# Patient Record
Sex: Female | Born: 1980
Health system: Southern US, Community
[De-identification: ages and names within clinical notes are randomized; demographics above are authoritative.]

## PROBLEM LIST (undated history)

## (undated) HISTORY — PX: TONSILLECTOMY: SUR1361

## (undated) HISTORY — PX: TUBAL LIGATION: SHX77

---

## 2008-06-02 HISTORY — PX: WISDOM TOOTH EXTRACTION: SHX21

## 2013-07-11 ENCOUNTER — Encounter: Payer: Self-pay | Admitting: Family Medicine

## 2013-07-11 ENCOUNTER — Ambulatory Visit (INDEPENDENT_AMBULATORY_CARE_PROVIDER_SITE_OTHER): Payer: 59 | Admitting: Family Medicine

## 2013-07-11 ENCOUNTER — Telehealth: Payer: Self-pay | Admitting: Family Medicine

## 2013-07-11 VITALS — BP 107/76 | HR 81 | Temp 98.6°F | Ht 61.0 in | Wt 126.2 lb

## 2013-07-11 DIAGNOSIS — R21 Rash and other nonspecific skin eruption: Secondary | ICD-10-CM

## 2013-07-11 LAB — POCT WET PREP WITH KOH
Clue Cells Wet Prep HPF POC: NEGATIVE
KOH Prep POC: POSITIVE
RBC Wet Prep HPF POC: NEGATIVE
Trichomonas, UA: NEGATIVE
WBC Wet Prep HPF POC: NEGATIVE

## 2013-07-11 MED ORDER — NYSTATIN 100000 UNIT/GM EX CREA: 1.0000 "application " | TOPICAL_CREAM | Freq: Two times a day (BID) | CUTANEOUS | Status: AC

## 2013-07-11 NOTE — Telephone Encounter (Signed)
appt at 12 with wong 

## 2013-07-11 NOTE — Progress Notes (Signed)
Patient ID: Katrina Duncan, female   DOB: 1980-11-19, 33 y.o.   MRN: [id number] SUBJECTIVE: CC: Chief Complaint  Patient presents with  . Acute Visit    rash on abd area and itchy    HPI:  This  Recently started. 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 She  Recently delivered a  Baby and is breast feeding. A rash started on the abdomen as a fine bump. Has been well without any problems. But new bumps are cropping up around the sides. No fever, no recent URI. No recent sore throat or illnesses.  No other contacts with allergen. She has not been outdoors. Has a dog.  No past medical history on file. No past surgical history on file. History   Social History  . Marital Status: Married    Spouse Name: N/A    Number of Children: N/A  . Years of Education: N/A   Occupational History  . Not on file.   Social History Main Topics  . Smoking status: Never Smoker   . Smokeless tobacco: Not on file  . Alcohol Use: Not on file  . Drug Use: Not on file  . Sexual Activity: Not on file   Other Topics Concern  . Not on file   Social History Narrative  . No narrative on file   No family history on file. No current outpatient prescriptions on file prior to visit.   No current facility-administered medications on file prior to visit.   Allergies  Allergen Reactions  . Penicillins     hives    There is no immunization history on file for this patient. Prior to Admission medications   Not on File     ROS: As above in the HPI. All other systems are stable or negative.  OBJECTIVE: APPEARANCE:  Patient in no acute distress.The patient appeared well nourished and normally developed. Acyanotic. Waist: VITAL SIGNS:BP 107/76  Pulse 81  Temp(Src) 98.6 F (37 C) (Oral)  Ht 5\' 1"  (1.549 m)  Wt 126 lb 3.2 oz (57.244 kg)  BMI 23.86 kg/m2   SKIN: warm and  Dry with a fine papular rash on the abdomen around the umbilicus with scratch marks. And 2 bumps  ,one on the far right flank and the other superior to the umbilicus. No tracks  Seen. None on the extremities. No LN palpable.  HEAD and Neck: without JVD, Head and scalp: normal Eyes:No scleral icterus. Fundi normal, eye movements normal. Ears: Auricle normal, canal normal, Tympanic membranes normal, insufflation normal. Nose: normal Throat: normal Neck & thyroid: normal  CHEST & LUNGS: Chest wall: normal Lungs: Clear  CVS: Reveals the PMI to be normally located. Regular rhythm, First and Second Heart sounds are normal,  absence of murmurs, rubs or gallops. Peripheral vasculature: Radial pulses: normal Dorsal pedis pulses: normal Posterior pulses: normal  ABDOMEN:  Appearance: normal Benign, no organomegaly, no masses, no Abdominal Aortic enlargement. No Guarding , no rebound. No Bruits. Bowel sounds: normal  NEUROLOGIC: oriented to time,place and person; nonfocal. Strength is normal Sensory is normal Reflexes are normal Cranial Nerves are normal.  ASSESSMENT:  Rash and nonspecific skin eruption - Plan: POCT Wet Prep with KOH Fungal/candida/yeast? Vs other causes. Will treat as fungal for now with nystatin  PLAN:  Orders Placed This Encounter  Procedures  . POCT Wet Prep with KOH   Results for orders placed in visit on 07/11/13  POCT WET PREP WITH KOH  Result Value Range   Trichomonas, UA Negative     Clue Cells Wet Prep HPF POC neg     Epithelial Wet Prep HPF POC mod     Yeast Wet Prep HPF POC mod     Bacteria Wet Prep HPF POC occ     RBC Wet Prep HPF POC neg     WBC Wet Prep HPF POC neg     KOH Prep POC Positive     Skin care. Discussed with patient her diagnosis.  Meds ordered this encounter  Medications  . nystatin cream (MYCOSTATIN)    Sig: Apply 1 application topically 2 (two) times daily.    Dispense:  60 g    Refill:  0   There are no discontinued medications. Return if symptoms worsen or fail to improve.  Lavera Vandermeer P. Modesto Charon,  M.D.

## 2014-02-15 ENCOUNTER — Telehealth: Payer: Self-pay | Admitting: Nurse Practitioner

## 2014-02-15 NOTE — Telephone Encounter (Signed)
appt offered for today but told patient it would be a double book and patient stated tomorrow would be fine. appt scheduled for 2:30 tomorrow with Annette Stable

## 2014-02-16 ENCOUNTER — Ambulatory Visit: Payer: 59 | Admitting: Family Medicine

## 2014-08-21 ENCOUNTER — Encounter: Payer: Self-pay | Admitting: Physician Assistant

## 2014-08-21 ENCOUNTER — Ambulatory Visit (INDEPENDENT_AMBULATORY_CARE_PROVIDER_SITE_OTHER): Payer: 59 | Admitting: Physician Assistant

## 2014-08-21 VITALS — BP 110/69 | HR 95 | Temp 98.6°F | Ht 61.0 in | Wt 122.4 lb

## 2014-08-21 DIAGNOSIS — L309 Dermatitis, unspecified: Secondary | ICD-10-CM

## 2014-08-21 NOTE — Progress Notes (Signed)
   Subjective:    Patient ID: Katrina Duncan, female    DOB: Sep 09, 1980, 34 y.o.   MRN: [id number]  HPI 34 y/o pregnant female presents with non pruritic rash on right side x 2 weeks. Has used Aquaphor with no relief.     Review of Systems  Skin: Positive for color change and rash (right side, under breast ).       Objective:   Physical Exam  Skin: Rash (localitzed patch  with raised borders, erythematous, no scale under right breast) noted. There is erythema.  Vitals reviewed.         Assessment & Plan:  1. Eczema: Continue to use Dove soap, use OTC Aveeno eczema lotion several times daily as needed. OTC Hydrocortisone 1% BID x 7 days. F/U is s/s worsen or dni after 2 weeks.

## 2014-08-21 NOTE — Patient Instructions (Signed)
Eczema Eczema, also called atopic dermatitis, is a skin disorder that causes inflammation of the skin. It causes a red rash and dry, scaly skin. The skin becomes very itchy. Eczema is generally worse during the cooler winter months and often improves with the warmth of summer. Eczema usually starts showing signs in infancy. Some children outgrow eczema, but it may last through adulthood.  CAUSES  The exact cause of eczema is not known, but it appears to run in families. People with eczema often have a family history of eczema, allergies, asthma, or hay fever. Eczema is not contagious. Flare-ups of the condition may be caused by:   Contact with something you are sensitive or allergic to.   Stress. SIGNS AND SYMPTOMS  Dry, scaly skin.   Red, itchy rash.   Itchiness. This may occur before the skin rash and may be very intense.  DIAGNOSIS  The diagnosis of eczema is usually made based on symptoms and medical history. TREATMENT  Eczema cannot be cured, but symptoms usually can be controlled with treatment and other strategies. A treatment plan might include:  Controlling the itching and scratching.   Use over-the-counter antihistamines as directed for itching. This is especially useful at night when the itching tends to be worse.   Use over-the-counter steroid creams as directed for itching.   Avoid scratching. Scratching makes the rash and itching worse. It may also result in a skin infection (impetigo) due to a break in the skin caused by scratching.   Keeping the skin well moisturized with creams every day. This will seal in moisture and help prevent dryness. Lotions that contain alcohol and water should be avoided because they can dry the skin.   Limiting exposure to things that you are sensitive or allergic to (allergens).   Recognizing situations that cause stress.   Developing a plan to manage stress.  HOME CARE INSTRUCTIONS   Only take over-the-counter or  prescription medicines as directed by your health care provider.   Do not use anything on the skin without checking with your health care provider.   Keep baths or showers short (5 minutes) in warm (not hot) water. Use mild cleansers for bathing. These should be unscented. You may add nonperfumed bath oil to the bath water. It is best to avoid soap and bubble bath.   Immediately after a bath or shower, when the skin is still damp, apply a moisturizing ointment to the entire body. This ointment should be a petroleum ointment. This will seal in moisture and help prevent dryness. The thicker the ointment, the better. These should be unscented.   Keep fingernails cut short. Children with eczema may need to wear soft gloves or mittens at night after applying an ointment.   Dress in clothes made of cotton or cotton blends. Dress lightly, because heat increases itching.   A child with eczema should stay away from anyone with fever blisters or cold sores. The virus that causes fever blisters (herpes simplex) can cause a serious skin infection in children with eczema. SEEK MEDICAL CARE IF:   Your itching interferes with sleep.   Your rash gets worse or is not better within 1 week after starting treatment.   You see pus or soft yellow scabs in the rash area.   You have a fever.   You have a rash flare-up after contact with someone who has fever blisters.  Document Released: 05/16/2000 Document Revised: 03/09/2013 Document Reviewed: 12/20/2012 Exit[employer] Patient Information 2015 [employer]. This information   is not intended to replace advice given to you by your health care provider. Make sure you discuss any questions you have with your health care provider.  

## 2015-01-12 LAB — OB RESULTS CONSOLE GC/CHLAMYDIA
Chlamydia: NEGATIVE
GC PROBE AMP, GENITAL: NEGATIVE

## 2015-01-12 LAB — OB RESULTS CONSOLE HIV ANTIBODY (ROUTINE TESTING): HIV: NONREACTIVE

## 2015-01-12 LAB — OB RESULTS CONSOLE RUBELLA ANTIBODY, IGM: RUBELLA: IMMUNE

## 2015-01-12 LAB — OB RESULTS CONSOLE HEPATITIS B SURFACE ANTIGEN: Hepatitis B Surface Ag: NEGATIVE

## 2015-01-12 LAB — OB RESULTS CONSOLE RPR: RPR: NONREACTIVE

## 2015-02-14 ENCOUNTER — Ambulatory Visit (INDEPENDENT_AMBULATORY_CARE_PROVIDER_SITE_OTHER): Payer: 59 | Admitting: Family Medicine

## 2015-02-14 ENCOUNTER — Encounter (INDEPENDENT_AMBULATORY_CARE_PROVIDER_SITE_OTHER): Payer: Self-pay

## 2015-02-14 ENCOUNTER — Encounter: Payer: Self-pay | Admitting: Family Medicine

## 2015-02-14 VITALS — BP 106/70 | HR 99 | Temp 98.2°F | Ht 61.0 in | Wt 150.2 lb

## 2015-02-14 DIAGNOSIS — J209 Acute bronchitis, unspecified: Secondary | ICD-10-CM

## 2015-02-14 MED ORDER — CEFDINIR 300 MG PO CAPS: 300.0000 mg | ORAL_CAPSULE | Freq: Two times a day (BID) | ORAL | Status: DC

## 2015-02-14 MED ORDER — FLUTICASONE PROPIONATE 50 MCG/ACT NA SUSP: 2.0000 | Freq: Every day | NASAL | Status: DC

## 2015-02-14 MED ORDER — ALBUTEROL SULFATE HFA 108 (90 BASE) MCG/ACT IN AERS: 2.0000 | INHALATION_SPRAY | Freq: Four times a day (QID) | RESPIRATORY_TRACT | Status: DC | PRN

## 2015-02-14 NOTE — Progress Notes (Signed)
BP 106/70 mmHg  Pulse 99  Temp(Src) 98.2 F (36.8 C) (Oral)  Ht 5\' 1"  (1.549 m)  Wt 150 lb 3.2 oz (68.13 kg)  BMI 28.39 kg/m2  LMP 07/02/2014   Subjective:    Patient ID: Katrina Duncan, female    DOB: 04-29-1981, 34 y.o.   MRN: [id number]  HPI: Katrina Duncan is a 34 y.o. female presenting on 02/14/2015 for Chest congestion and Cough   HPI Sore throat Patient has been having difficulty with sore throat and congestion for 3 weeks. She also has had fevers and postnasal drainage and sinus pressure over the past 3 weeks. She felt like it was getting better after the first week but then it has worsened again in the past week. Her husband works at a nursing home and has come down with something major from that. She had a low-grade fever a week ago and feels like that may be starting up again. Her cough is productive of clear to white sputum.  Relevant past medical, surgical, family and social history reviewed and updated as indicated. Interim medical history since our last visit reviewed. Allergies and medications reviewed and updated.  Review of Systems  Constitutional: Negative for fever and chills.  HENT: Positive for postnasal drip, rhinorrhea, sinus pressure and sore throat. Negative for congestion, ear discharge, ear pain and voice change.   Eyes: Negative for redness and visual disturbance.  Respiratory: Positive for cough. Negative for chest tightness, shortness of breath and wheezing.   Cardiovascular: Negative for chest pain, palpitations and leg swelling.  Genitourinary: Negative for dysuria and difficulty urinating.  Musculoskeletal: Negative for back pain and gait problem.  Skin: Negative for rash.  Neurological: Negative for light-headedness and headaches.  Psychiatric/Behavioral: Negative for behavioral problems and agitation.  All other systems reviewed and are negative.   Per HPI unless specifically indicated above     Medication List       This  list is accurate as of: 02/14/15 12:46 PM.  Always use your most recent med list.               albuterol 108 (90 BASE) MCG/ACT inhaler  Commonly known as:  PROVENTIL HFA;VENTOLIN HFA  Inhale 2 puffs into the lungs every 6 (six) hours as needed for wheezing or shortness of breath.     calcium carbonate 1250 MG capsule  Take 1,250 mg by mouth 2 (two) times daily with a meal.     cefdinir 300 MG capsule  Commonly known as:  OMNICEF  Take 1 capsule (300 mg total) by mouth 2 (two) times daily. 1 po BID     fluticasone 50 MCG/ACT nasal spray  Commonly known as:  FLONASE  Place 2 sprays into both nostrils daily.     multivitamin-prenatal 27-0.8 MG Tabs tablet  Take 1 tablet by mouth daily at 12 noon.           Objective:    BP 106/70 mmHg  Pulse 99  Temp(Src) 98.2 F (36.8 C) (Oral)  Ht 5\' 1"  (1.549 m)  Wt 150 lb 3.2 oz (68.13 kg)  BMI 28.39 kg/m2  LMP 07/02/2014  Wt Readings from Last 3 Encounters:  02/14/15 150 lb 3.2 oz (68.13 kg)  08/21/14 122 lb 6.4 oz (55.52 kg)    Physical Exam  Constitutional: She is oriented to person, place, and time. She appears well-developed and well-nourished. No distress.  HENT:  Right Ear: Tympanic membrane, external ear and ear canal normal.  Left  Ear: Tympanic membrane, external ear and ear canal normal.  Nose: Mucosal edema and rhinorrhea present. Right sinus exhibits no maxillary sinus tenderness and no frontal sinus tenderness. Left sinus exhibits no maxillary sinus tenderness and no frontal sinus tenderness.  Mouth/Throat: Uvula is midline and mucous membranes are normal. Posterior oropharyngeal edema and posterior oropharyngeal erythema present. No oropharyngeal exudate or tonsillar abscesses.  Eyes: Conjunctivae and EOM are normal. Pupils are equal, round, and reactive to light.  Cardiovascular: Normal rate, regular rhythm, normal heart sounds and intact distal pulses.   No murmur heard. Pulmonary/Chest: Effort normal and breath  sounds normal. No respiratory distress. She has no wheezes.  Musculoskeletal: Normal range of motion. She exhibits no edema or tenderness.  Neurological: She is alert and oriented to person, place, and time. Coordination normal.  Skin: Skin is warm and dry. No rash noted. She is not diaphoretic.  Psychiatric: She has a normal mood and affect. Her behavior is normal.  Vitals reviewed.   No results found for this or any previous visit.    Assessment & Plan:   Problem List Items Addressed This Visit    None    Visit Diagnoses    Acute bronchitis, unspecified organism    -  Primary    Persistent symptoms for 3 weeks. Pregnancy makes her immunosuppressed. We'll try albuterol, Flonase, Omnicef.    Relevant Medications    albuterol (PROVENTIL HFA;VENTOLIN HFA) 108 (90 BASE) MCG/ACT inhaler    fluticasone (FLONASE) 50 MCG/ACT nasal spray    cefdinir (OMNICEF) 300 MG capsule        Follow up plan: Return if symptoms worsen or fail to improve.  Arville Care, MD [facility] 02/14/2015, 12:46 PM

## 2015-02-14 NOTE — Patient Instructions (Signed)

## 2015-03-06 LAB — OB RESULTS CONSOLE GBS: STREP GROUP B AG: POSITIVE

## 2015-03-30 ENCOUNTER — Inpatient Hospital Stay (HOSPITAL_COMMUNITY): Payer: 59 | Admitting: Anesthesiology

## 2015-03-30 ENCOUNTER — Encounter (HOSPITAL_COMMUNITY): Payer: Self-pay

## 2015-03-30 ENCOUNTER — Encounter (HOSPITAL_COMMUNITY): Payer: Self-pay | Admitting: Anesthesiology

## 2015-03-30 ENCOUNTER — Encounter (HOSPITAL_COMMUNITY)
Admission: AD | Disposition: A | Discharge status: Home / Self Care | Payer: Self-pay | Source: Ambulatory Visit | Attending: Obstetrics and Gynecology

## 2015-03-30 ENCOUNTER — Inpatient Hospital Stay (HOSPITAL_COMMUNITY)
Admission: AD | Admit: 2015-03-30 | Discharge: 2015-04-01 | DRG: 766 | Disposition: A | Discharge status: Home / Self Care | Payer: 59 | Source: Ambulatory Visit | Attending: Obstetrics and Gynecology | Admitting: Obstetrics and Gynecology

## 2015-03-30 DIAGNOSIS — O34219 Maternal care for unspecified type scar from previous cesarean delivery: Secondary | ICD-10-CM | POA: Diagnosis present

## 2015-03-30 DIAGNOSIS — Z3A38 38 weeks gestation of pregnancy: Secondary | ICD-10-CM | POA: Diagnosis not present

## 2015-03-30 DIAGNOSIS — Z98891 History of uterine scar from previous surgery: Secondary | ICD-10-CM

## 2015-03-30 DIAGNOSIS — O4292 Full-term premature rupture of membranes, unspecified as to length of time between rupture and onset of labor: Secondary | ICD-10-CM | POA: Diagnosis present

## 2015-03-30 DIAGNOSIS — Z9851 Tubal ligation status: Secondary | ICD-10-CM

## 2015-03-30 DIAGNOSIS — Z302 Encounter for sterilization: Secondary | ICD-10-CM | POA: Diagnosis not present

## 2015-03-30 DIAGNOSIS — O99824 Streptococcus B carrier state complicating childbirth: Secondary | ICD-10-CM | POA: Diagnosis present

## 2015-03-30 DIAGNOSIS — O4290 Premature rupture of membranes, unspecified as to length of time between rupture and onset of labor, unspecified weeks of gestation: Secondary | ICD-10-CM | POA: Diagnosis present

## 2015-03-30 LAB — RPR: RPR: NONREACTIVE

## 2015-03-30 LAB — CBC
HCT: 35.6 % — ABNORMAL LOW (ref 36.0–46.0)
Hemoglobin: 12.2 g/dL (ref 12.0–15.0)
MCH: 32.2 pg (ref 26.0–34.0)
MCHC: 34.3 g/dL (ref 30.0–36.0)
MCV: 93.9 fL (ref 78.0–100.0)
Platelets: 149 10*3/uL — ABNORMAL LOW (ref 150–400)
RBC: 3.79 MIL/uL — ABNORMAL LOW (ref 3.87–5.11)
RDW: 13.1 % (ref 11.5–15.5)
WBC: 11.4 10*3/uL — ABNORMAL HIGH (ref 4.0–10.5)

## 2015-03-30 LAB — TYPE AND SCREEN
ABO/RH(D): A POS
Antibody Screen: NEGATIVE

## 2015-03-30 LAB — ABO/RH: ABO/RH(D): A POS

## 2015-03-30 LAB — POCT FERN TEST: POCT FERN TEST: POSITIVE

## 2015-03-30 SURGERY — Surgical Case
Anesthesia: Spinal

## 2015-03-30 MED ORDER — KETOROLAC TROMETHAMINE 30 MG/ML IJ SOLN
30.0000 mg | Freq: Four times a day (QID) | INTRAMUSCULAR | Status: AC | PRN
  Administered 2015-03-30: 30 mg via INTRAMUSCULAR

## 2015-03-30 MED ORDER — NALBUPHINE HCL 10 MG/ML IJ SOLN: 5.0000 mg | Freq: Once | INTRAMUSCULAR | Status: DC | PRN

## 2015-03-30 MED ORDER — ONDANSETRON HCL 4 MG/2ML IJ SOLN
INTRAMUSCULAR | Status: AC
  Filled 2015-03-30: qty 2

## 2015-03-30 MED ORDER — NALBUPHINE HCL 10 MG/ML IJ SOLN: 5.0000 mg | INTRAMUSCULAR | Status: DC | PRN

## 2015-03-30 MED ORDER — TETANUS-DIPHTH-ACELL PERTUSSIS 5-2.5-18.5 LF-MCG/0.5 IM SUSP: 0.5000 mL | Freq: Once | INTRAMUSCULAR | Status: DC

## 2015-03-30 MED ORDER — NALOXONE HCL 0.4 MG/ML IJ SOLN: 0.4000 mg | INTRAMUSCULAR | Status: DC | PRN

## 2015-03-30 MED ORDER — ERYTHROMYCIN 5 MG/GM OP OINT
TOPICAL_OINTMENT | OPHTHALMIC | Status: AC
  Filled 2015-03-30: qty 1

## 2015-03-30 MED ORDER — IBUPROFEN 600 MG PO TABS
600.0000 mg | ORAL_TABLET | Freq: Four times a day (QID) | ORAL | Status: DC | PRN
  Administered 2015-03-31 (×2): 600 mg via ORAL

## 2015-03-30 MED ORDER — PHENYLEPHRINE 8 MG IN D5W 100 ML (0.08MG/ML) PREMIX OPTIME
INJECTION | INTRAVENOUS | Status: DC | PRN
  Administered 2015-03-30: 60 ug/min via INTRAVENOUS

## 2015-03-30 MED ORDER — OXYCODONE-ACETAMINOPHEN 5-325 MG PO TABS: 2.0000 | ORAL_TABLET | ORAL | Status: DC | PRN

## 2015-03-30 MED ORDER — SCOPOLAMINE 1 MG/3DAYS TD PT72
1.0000 | MEDICATED_PATCH | TRANSDERMAL | Status: DC
  Administered 2015-03-30: 1.5 mg via TRANSDERMAL
  Filled 2015-03-30: qty 1

## 2015-03-30 MED ORDER — KETOROLAC TROMETHAMINE 30 MG/ML IJ SOLN
INTRAMUSCULAR | Status: AC
  Filled 2015-03-30: qty 1

## 2015-03-30 MED ORDER — PRENATAL MULTIVITAMIN CH
1.0000 | ORAL_TABLET | Freq: Every day | ORAL | Status: DC
  Administered 2015-03-31 – 2015-04-01 (×2): 1 via ORAL
  Filled 2015-03-30 (×2): qty 1

## 2015-03-30 MED ORDER — 0.9 % SODIUM CHLORIDE (POUR BTL) OPTIME
TOPICAL | Status: DC | PRN
  Administered 2015-03-30: 1000 mL

## 2015-03-30 MED ORDER — FENTANYL CITRATE (PF) 100 MCG/2ML IJ SOLN
INTRAMUSCULAR | Status: AC
  Filled 2015-03-30: qty 4

## 2015-03-30 MED ORDER — MORPHINE SULFATE (PF) 0.5 MG/ML IJ SOLN
INTRAMUSCULAR | Status: DC | PRN
  Administered 2015-03-30: .15 mg via INTRATHECAL

## 2015-03-30 MED ORDER — LACTATED RINGERS IV SOLN
INTRAVENOUS | Status: DC | PRN
  Administered 2015-03-30: 08:00:00 via INTRAVENOUS

## 2015-03-30 MED ORDER — SENNOSIDES-DOCUSATE SODIUM 8.6-50 MG PO TABS
2.0000 | ORAL_TABLET | ORAL | Status: DC
  Administered 2015-03-31 – 2015-04-01 (×2): 2 via ORAL
  Filled 2015-03-30 (×2): qty 2

## 2015-03-30 MED ORDER — ZOLPIDEM TARTRATE 5 MG PO TABS: 5.0000 mg | ORAL_TABLET | Freq: Every evening | ORAL | Status: DC | PRN

## 2015-03-30 MED ORDER — CLINDAMYCIN PHOSPHATE 900 MG/50ML IV SOLN
900.0000 mg | Freq: Once | INTRAVENOUS | Status: AC
  Administered 2015-03-30: 900 mg via INTRAVENOUS
  Filled 2015-03-30: qty 50

## 2015-03-30 MED ORDER — ONDANSETRON HCL 4 MG/2ML IJ SOLN: 4.0000 mg | Freq: Three times a day (TID) | INTRAMUSCULAR | Status: DC | PRN

## 2015-03-30 MED ORDER — BUPIVACAINE IN DEXTROSE 0.75-8.25 % IT SOLN
INTRATHECAL | Status: DC | PRN
Start: 2015-03-30 — End: 2015-03-30
  Administered 2015-03-30: 1.4 mL via INTRATHECAL

## 2015-03-30 MED ORDER — DIPHENHYDRAMINE HCL 25 MG PO CAPS: 25.0000 mg | ORAL_CAPSULE | Freq: Four times a day (QID) | ORAL | Status: DC | PRN

## 2015-03-30 MED ORDER — CITRIC ACID-SODIUM CITRATE 334-500 MG/5ML PO SOLN
30.0000 mL | Freq: Once | ORAL | Status: AC
  Administered 2015-03-30: 30 mL via ORAL
  Filled 2015-03-30: qty 15

## 2015-03-30 MED ORDER — LACTATED RINGERS IV SOLN
INTRAVENOUS | Status: DC | PRN
  Administered 2015-03-30: 09:00:00 via INTRAVENOUS

## 2015-03-30 MED ORDER — OXYTOCIN 10 UNIT/ML IJ SOLN
40.0000 [IU] | INTRAVENOUS | Status: DC | PRN
  Administered 2015-03-30: 40 [IU] via INTRAVENOUS

## 2015-03-30 MED ORDER — FENTANYL CITRATE (PF) 100 MCG/2ML IJ SOLN
INTRAMUSCULAR | Status: DC | PRN
  Administered 2015-03-30: 25 ug via INTRATHECAL

## 2015-03-30 MED ORDER — DIPHENHYDRAMINE HCL 50 MG/ML IJ SOLN: 12.5000 mg | INTRAMUSCULAR | Status: DC | PRN

## 2015-03-30 MED ORDER — FAMOTIDINE IN NACL 20-0.9 MG/50ML-% IV SOLN
20.0000 mg | Freq: Once | INTRAVENOUS | Status: AC
  Administered 2015-03-30: 20 mg via INTRAVENOUS
  Filled 2015-03-30: qty 50

## 2015-03-30 MED ORDER — SCOPOLAMINE 1 MG/3DAYS TD PT72
1.0000 | MEDICATED_PATCH | Freq: Once | TRANSDERMAL | Status: DC
  Filled 2015-03-30: qty 1

## 2015-03-30 MED ORDER — FENTANYL CITRATE (PF) 100 MCG/2ML IJ SOLN: 25.0000 ug | INTRAMUSCULAR | Status: DC | PRN

## 2015-03-30 MED ORDER — ACETAMINOPHEN 325 MG PO TABS
650.0000 mg | ORAL_TABLET | ORAL | Status: DC | PRN
  Administered 2015-03-31: 650 mg via ORAL
  Filled 2015-03-30: qty 2

## 2015-03-30 MED ORDER — NALOXONE HCL 2 MG/2ML IJ SOSY
1.0000 ug/kg/h | PREFILLED_SYRINGE | INTRAVENOUS | Status: DC | PRN
  Filled 2015-03-30: qty 2

## 2015-03-30 MED ORDER — ONDANSETRON HCL 4 MG/2ML IJ SOLN
INTRAMUSCULAR | Status: DC | PRN
  Administered 2015-03-30: 4 mg via INTRAVENOUS

## 2015-03-30 MED ORDER — DIBUCAINE 1 % RE OINT: 1.0000 "application " | TOPICAL_OINTMENT | RECTAL | Status: DC | PRN

## 2015-03-30 MED ORDER — LACTATED RINGERS IV SOLN
INTRAVENOUS | Status: DC
  Administered 2015-03-30: 18:00:00 via INTRAVENOUS

## 2015-03-30 MED ORDER — SIMETHICONE 80 MG PO CHEW: 80.0000 mg | CHEWABLE_TABLET | ORAL | Status: DC | PRN

## 2015-03-30 MED ORDER — SODIUM CHLORIDE 0.9 % IJ SOLN
3.0000 mL | INTRAMUSCULAR | Status: DC | PRN
  Administered 2015-03-31: 3 mL via INTRAVENOUS
  Filled 2015-03-30: qty 3

## 2015-03-30 MED ORDER — MORPHINE SULFATE (PF) 0.5 MG/ML IJ SOLN
INTRAMUSCULAR | Status: AC
  Filled 2015-03-30: qty 100

## 2015-03-30 MED ORDER — IBUPROFEN 600 MG PO TABS
600.0000 mg | ORAL_TABLET | Freq: Four times a day (QID) | ORAL | Status: DC
  Administered 2015-03-30 – 2015-04-01 (×6): 600 mg via ORAL
  Filled 2015-03-30 (×8): qty 1

## 2015-03-30 MED ORDER — SIMETHICONE 80 MG PO CHEW
80.0000 mg | CHEWABLE_TABLET | Freq: Three times a day (TID) | ORAL | Status: DC
  Administered 2015-03-30 – 2015-04-01 (×5): 80 mg via ORAL
  Filled 2015-03-30 (×5): qty 1

## 2015-03-30 MED ORDER — OXYTOCIN 40 UNITS IN LACTATED RINGERS INFUSION - SIMPLE MED: 62.5000 mL/h | INTRAVENOUS | Status: AC

## 2015-03-30 MED ORDER — OXYTOCIN 10 UNIT/ML IJ SOLN
INTRAMUSCULAR | Status: AC
  Filled 2015-03-30: qty 4

## 2015-03-30 MED ORDER — KETOROLAC TROMETHAMINE 30 MG/ML IJ SOLN: 30.0000 mg | Freq: Four times a day (QID) | INTRAMUSCULAR | Status: AC | PRN

## 2015-03-30 MED ORDER — DIPHENHYDRAMINE HCL 25 MG PO CAPS: 25.0000 mg | ORAL_CAPSULE | ORAL | Status: DC | PRN

## 2015-03-30 MED ORDER — LACTATED RINGERS IV BOLUS (SEPSIS)
1000.0000 mL | Freq: Once | INTRAVENOUS | Status: AC
  Administered 2015-03-30: 1000 mL via INTRAVENOUS

## 2015-03-30 MED ORDER — WITCH HAZEL-GLYCERIN EX PADS: 1.0000 "application " | MEDICATED_PAD | CUTANEOUS | Status: DC | PRN

## 2015-03-30 MED ORDER — OXYCODONE-ACETAMINOPHEN 5-325 MG PO TABS: 1.0000 | ORAL_TABLET | ORAL | Status: DC | PRN

## 2015-03-30 MED ORDER — PHENYLEPHRINE 8 MG IN D5W 100 ML (0.08MG/ML) PREMIX OPTIME
INJECTION | INTRAVENOUS | Status: AC
  Filled 2015-03-30: qty 100

## 2015-03-30 MED ORDER — LANOLIN HYDROUS EX OINT: 1.0000 "application " | TOPICAL_OINTMENT | CUTANEOUS | Status: DC | PRN

## 2015-03-30 MED ORDER — MENTHOL 3 MG MT LOZG: 1.0000 | LOZENGE | OROMUCOSAL | Status: DC | PRN

## 2015-03-30 MED ORDER — MEPERIDINE HCL 25 MG/ML IJ SOLN: 6.2500 mg | INTRAMUSCULAR | Status: DC | PRN

## 2015-03-30 MED ORDER — SIMETHICONE 80 MG PO CHEW
80.0000 mg | CHEWABLE_TABLET | ORAL | Status: DC
  Administered 2015-03-31 – 2015-04-01 (×2): 80 mg via ORAL
  Filled 2015-03-30 (×2): qty 1

## 2015-03-30 SURGICAL SUPPLY — 33 items
BENZOIN TINCTURE PRP APPL 2/3 (GAUZE/BANDAGES/DRESSINGS) ×2 IMPLANT
CLAMP CORD UMBIL (MISCELLANEOUS) IMPLANT
CLOTH BEACON ORANGE TIMEOUT ST (SAFETY) ×2 IMPLANT
DRAPE C SECTION CLR SCREEN (DRAPES) ×2 IMPLANT
DRAPE SHEET LG 3/4 BI-LAMINATE (DRAPES) IMPLANT
DRSG OPSITE 6X11 MED (GAUZE/BANDAGES/DRESSINGS) ×2 IMPLANT
DRSG OPSITE POSTOP 4X10 (GAUZE/BANDAGES/DRESSINGS) ×2 IMPLANT
DURAPREP 26ML APPLICATOR (WOUND CARE) ×2 IMPLANT
ELECT REM PT RETURN 9FT ADLT (ELECTROSURGICAL) ×2
ELECTRODE REM PT RTRN 9FT ADLT (ELECTROSURGICAL) ×1 IMPLANT
EXTRACTOR VACUUM KIWI (MISCELLANEOUS) IMPLANT
GLOVE BIO SURGEON STRL SZ 6.5 (GLOVE) ×2 IMPLANT
GOWN STRL REUS W/TWL LRG LVL3 (GOWN DISPOSABLE) ×4 IMPLANT
KIT ABG SYR 3ML LUER SLIP (SYRINGE) IMPLANT
NEEDLE HYPO 25X5/8 SAFETYGLIDE (NEEDLE) IMPLANT
NS IRRIG 1000ML POUR BTL (IV SOLUTION) ×2 IMPLANT
PACK C SECTION WH (CUSTOM PROCEDURE TRAY) ×2 IMPLANT
PAD OB MATERNITY 4.3X12.25 (PERSONAL CARE ITEMS) ×2 IMPLANT
PENCIL SMOKE EVAC W/HOLSTER (ELECTROSURGICAL) ×2 IMPLANT
RTRCTR C-SECT PINK 25CM LRG (MISCELLANEOUS) ×2 IMPLANT
STRIP CLOSURE SKIN 1/2X4 (GAUZE/BANDAGES/DRESSINGS) ×2 IMPLANT
SUT CHROMIC 1 CTX 36 (SUTURE) ×4 IMPLANT
SUT PLAIN 0 NONE (SUTURE) ×2 IMPLANT
SUT PLAIN 2 0 XLH (SUTURE) ×2 IMPLANT
SUT VIC AB 0 CT1 27 (SUTURE) ×2
SUT VIC AB 0 CT1 27XBRD ANBCTR (SUTURE) ×2 IMPLANT
SUT VIC AB 2-0 CT1 27 (SUTURE) ×1
SUT VIC AB 2-0 CT1 TAPERPNT 27 (SUTURE) ×1 IMPLANT
SUT VIC AB 3-0 CT1 27 (SUTURE)
SUT VIC AB 3-0 CT1 TAPERPNT 27 (SUTURE) IMPLANT
SUT VIC AB 4-0 KS 27 (SUTURE) ×2 IMPLANT
TOWEL OR 17X24 6PK STRL BLUE (TOWEL DISPOSABLE) ×2 IMPLANT
TRAY FOLEY CATH SILVER 14FR (SET/KITS/TRAYS/PACK) ×2 IMPLANT

## 2015-03-30 NOTE — Lactation Note (Signed)
This note was copied from the chart of [name]. Lactation Consultation Note  Patient Name: [name] ZOXWR'UToday's Date: 03/30/2015 Reason for consult: Initial assessment (mom knows to call with feeding cues)  Baby is 5 hours old , breast fed x 1 in PACU for 10 mins. Both parents mentioned how impressed they were that the  Baby latched so well with swallows compared to there 1st baby 2 years ago .  LC reviewed basics - 1st changed a large wet diaper , and showed parents the blue line on the diaper.  Showed mom breast massage , hand express, several large drops of colostrum noted, baby opened wide , took a few sucks and released and  Back to sleep. Baby in football position skin to skin with mom. Mom and dad awake.  Mom mentioned her plan is to breast and bottle like her 1st baby . Mom already asking for formula.  LC mentioned we are here to support her decision to breast and formula . LC also recommended to mom and dad to consider allowing baby to get hungry  And work on giving him the opportunity to latch and enhance milk coming in quicker. Supply and demand. LC stressed the importance of skin to skin , hand expressing.  Per mom plans to obtain a DEBP after D/C form [facility] , for the Healthy pregnancy program.  Mother informed of post-discharge support and given phone number to the lactation department, including services for phone call assistance; out-patient appointments; and breastfeeding support group. List of other breastfeeding resources in the community given in the handout. Encouraged mother to call for problems or concerns related to breastfeeding. Mom and dad are aware to call for latch assessment.    Maternal Data Has patient been taught Hand Expression?: Yes (several small drops of colostrum noted. mom aware of how to use collector for colostrum ) Does the patient have breastfeeding experience prior to this delivery?: Yes  Feeding Feeding Type: Breast  Fed Length of feed:  (baby opened wide, latched with few sucks , shallow )  LATCH Score/Interventions Latch: Repeated attempts needed to sustain latch, nipple held in mouth throughout feeding, stimulation needed to elicit sucking reflex. Intervention(s): Adjust position;Assist with latch;Breast massage;Breast compression  Audible Swallowing: None Intervention(s): Skin to skin;Hand expression;Alternate breast massage  Type of Nipple: Everted at rest and after stimulation  Comfort (Breast/Nipple): Soft / non-tender     Hold (Positioning): Assistance needed to correctly position infant at breast and maintain latch. Intervention(s): Breastfeeding basics reviewed;Support Pillows;Position options;Skin to skin  LATCH Score: 6  Lactation Tools Discussed/Used WIC Program: No   Consult Status Consult Status: Follow-up Date: 03/30/15 Follow-up type: In-patient    Kathrin Duncan, Katrina Duncan 03/30/2015, 2:36 PM

## 2015-03-30 NOTE — H&P (Signed)
Katrina Duncan is a 34 y.o. female presenting for rupture of menmbranes. Pt was confirmed to be ruptured with light meconium stained fluid in MAU. She is appreciating some contractions but tolerating well. Prenatal care was uncomplicated - had a UTI that was treated. Her EDC is based on LMP and confirmed by first trimester u/s. She has a history of LTC/S and opted for repeat c/s this time. She is also requesting a bilateral tubal ligation. All options reviewed in depth - consent signed Maternal Medical History:  Reason for admission: Rupture of membranes.  Nausea.  Contractions: Onset was 1-2 hours ago.   Frequency: irregular.   Duration is approximately 5 minutes.   Perceived severity is mild.    Fetal activity: Perceived fetal activity is normal.   Last perceived fetal movement was within the past hour.    Prenatal complications: no prenatal complications   OB History    Gravida Para Term Preterm AB TAB SAB Ectopic Multiple Living   0 0 0 0 0 0 1     History reviewed. No pertinent past medical history. Past Surgical History  Procedure Laterality Date  . Tonsillectomy    . Cesarean section  2014  . Cesarean section  2014   Family History: family history includes Cancer in her father and mother; Lupus in her mother; Sjogren's syndrome in her mother; Ulcerative colitis in her father. Social History:  reports that she has never smoked. She does not have any smokeless tobacco history on file. She reports that she does not drink alcohol or use illicit drugs.   Prenatal Transfer Tool  Maternal Diabetes: No Genetic Screening: Declined Maternal Ultrasounds/Referrals: Normal Fetal Ultrasounds or other Referrals:  None Maternal Substance Abuse:  No Significant Maternal Medications:  None Significant Maternal Lab Results:  Lab values include: Group B Strep positive Other Comments:  allergy to PCN- rash  Review of Systems  Constitutional: Negative for fever, chills and  weight loss.  Eyes: Negative for blurred vision.  Respiratory: Negative for shortness of breath.   Cardiovascular: Negative for chest pain.  Gastrointestinal: Negative for heartburn, nausea, vomiting and abdominal pain.  Genitourinary: Negative for dysuria.  Musculoskeletal: Negative for back pain.  Neurological: Negative for dizziness and headaches.  Psychiatric/Behavioral: Negative for depression. The patient is nervous/anxious.     Dilation: 2.5 Effacement (%): 50 Station: -3 Exam by:: D Simpson RN Blood pressure 127/81, pulse 89, temperature 98.3 F (36.8 C), temperature source Oral, resp. rate 18, height 5' 1.5" (1.562 m), weight 155 lb (70.308 kg), last menstrual period 07/02/2014, SpO2 100 %. Maternal Exam:  Uterine Assessment: Contraction strength is mild.  Contraction duration is 5 minutes. Contraction frequency is irregular.   Abdomen: Patient reports no abdominal tenderness. Surgical scars: low transverse.   Estimated fetal weight is AGA.   Fetal presentation: vertex  Introitus: Normal vulva. Normal vagina.  Ferning test: not done.  Nitrazine test: not done. Amniotic fluid character: meconium stained. Evaluated by MAU staff  Cervix: Cervix evaluated by digital exam.   By MAU staff  Physical Exam  Constitutional: She is oriented to person, place, and time. She appears well-developed and well-nourished.  HENT:  Head: Normocephalic.  Neck: Normal range of motion.  Cardiovascular: Normal rate.   Respiratory: Effort normal.  GI: Soft.  Genitourinary: Vagina normal and uterus normal.  Musculoskeletal: Normal range of motion.  Neurological: She is alert and oriented to person, place, and time.  Skin: Skin is warm.  Psychiatric: She has a  normal mood and affect. Her behavior is normal. Judgment and thought content normal.    Prenatal labs: ABO, Rh:   Antibody:   Rubella:   RPR:    HBsAg:    HIV:    GBS:     Assessment/Plan: G2P1001 at 38 5/[redacted]wks ga for repeat  c/s and btl due to SROM  Dose of clindamycin given preop Consent verified To OR when ready   Sharol Givenecilia Worema Shona Pardo 03/30/2015, 8:00 AM

## 2015-03-30 NOTE — Addendum Note (Signed)
Addendum  created 03/30/15 1939 by Renford DillsJanet L Dejanique Ruehl, CRNA   Modules edited: Notes Section   Notes Section:  File: [id number]

## 2015-03-30 NOTE — Op Note (Signed)
Procedure note  Patient was taken to the operating room where spinal anesthesia was administered. She was prepped and draped in the normal sterile fashion in the dorsal supine position with a leftward tilt. An appropriate time out was performed. An allis clamp test was performed and anesthesia was found to be adequate.  A Pfannenstiel skin incision was then made through the previous incision with the scalpel and carried through to the underlying layer of fascia by sharp dissection. The fascia was nicked in the midline and the incision was extended laterally with Mayo scissors. The superior aspect of the incision was grasped Coker clamps and dissected off the underlying rectus muscles. In a similar fashion the inferior aspect was dissected off the rectus muscles. Rectus muscles were separated in the midline and the peritoneal cavity entered bluntly. The peritoneal incision was then extended both superiorly and inferiorly with careful attention to avoid both bowel and bladder. The Alexis self-retaining wound retractor was then placed within the incision and the lower uterine segment exposed. The bladder flap was developed with Metzenbaum scissors and pushed away from the lower uterine segment. The lower uterine segment was then incised in a transverse fashion and the cavity itself entered bluntly. Clear amniotic fluid was noted.  The incision was extended bluntly. The infant's head was then lifted and delivered from the incision without difficulty. The remainder of the infant delivered and the nose and mouth bulb suctioned with the cord clamped and cut as well. The infant was handed off to the waiting pediatricians. The placenta was then spontaneously expressed from the uterus and the uterus cleared of all clots and debris with moist lap sponge. The uterine incision was then repaired in a single layer with a  running locked layer 0 chromic suture. A figure 8 stitch of the same ws placed in middle of incision to stop  some mild bleeding. The left and then right tubes were then grasped with babcocks, clamped down to their fimbriated ends cut and tied using two 0 vicryl sutures; Second suture was placed under the first. Excellent hemostasis was appreciated. The  ovaries were inspected and the gutters cleared of all clots and debris. The uterine incision was inspected and found to be hemostatic. All instruments and sponges as well as the Alexis retractor were then removed from the abdomen. The rectus muscles and peritoneum were then reapproximated with 2-0 Vicryl. The fascia was then closed with 0 Vicryl in a running fashion. Subcutaneous tissue was irrigated and small areas of bleeding bovied. Layer was thin so did not need to be reapproximated. The skin was closed with a subcuticular stitch of 4-0 Vicryl on a Keith needle and then reinforced with benzoin and Steri-Strips. At the conclusion of the procedure all instruments and sponge counts were correct. Patient was taken to the recovery room in good condition with her baby accompanying her skin to skin.

## 2015-03-30 NOTE — Anesthesia Procedure Notes (Signed)
Spinal Patient location during procedure: OR Start time: 03/30/2015 8:24 AM Staffing Anesthesiologist: Mal AmabileFOSTER, Shaneka Efaw Performed by: anesthesiologist  Preanesthetic Checklist Completed: patient identified, site marked, surgical consent, pre-op evaluation, timeout performed, IV checked, risks and benefits discussed and monitors and equipment checked Spinal Block Patient position: sitting Prep: site prepped and draped and DuraPrep Patient monitoring: heart rate, cardiac monitor, continuous pulse ox and blood pressure Approach: midline Location: L3-4 Injection technique: single-shot Needle Needle type: Sprotte  Needle gauge: 24 G Needle length: 9 cm Assessment Sensory level: T4 Additional Notes Patient tolerated procedure well. Adequate sensory level.

## 2015-03-30 NOTE — Anesthesia Postprocedure Evaluation (Signed)
  Anesthesia Post-op Note  Patient: Katrina Duncan  Procedure(s) Performed: Procedure(s): CESAREAN SECTION (N/A)  Patient Location: 144   Anesthesia Type:Spinal  Level of Consciousness: awake  Airway and Oxygen Therapy: Patient Spontanous Breathing  Post-op Pain: mild  Post-op Assessment: Patient's Cardiovascular Status Stable and Respiratory Function Stable              Post-op Vital Signs: stable  Last Vitals:  Filed Vitals:   03/30/15 1800  BP: 104/66  Pulse: 77  Temp: 36.8 C  Resp: 18    Complications: No apparent anesthesia complications

## 2015-03-30 NOTE — MAU Note (Signed)
Pt states that her water broke at 0500-clear/pink fluid. Having some irregular contractions every 5 mins. +FM

## 2015-03-30 NOTE — Transfer of Care (Signed)
Immediate Anesthesia Transfer of Care Note  Patient: Katrina Duncan  Procedure(s) Performed: Procedure(s): CESAREAN SECTION (N/A)  Patient Location: PACU  Anesthesia Type:Spinal  Level of Consciousness: awake, alert , oriented and patient cooperative  Airway & Oxygen Therapy: Patient Spontanous Breathing  Post-op Assessment: Report given to RN and Post -op Vital signs reviewed and stable  Post vital signs: Reviewed and stable  Last Vitals:  Filed Vitals:   03/30/15 0615  BP: 127/81  Pulse: 89  Temp: 36.8 C  Resp: 18    Complications: No apparent anesthesia complications

## 2015-03-30 NOTE — Anesthesia Preprocedure Evaluation (Addendum)
Anesthesia Evaluation  Patient identified by MRN, date of birth, ID band Patient awake    Reviewed: Allergy & Precautions, NPO status , Patient's Chart, lab work & pertinent test results  Airway Mallampati: II  TM Distance: >3 FB Neck ROM: Full    Dental no notable dental hx. (+) Teeth Intact   Pulmonary neg pulmonary ROS,    Pulmonary exam normal breath sounds clear to auscultation       Cardiovascular negative cardio ROS Normal cardiovascular exam Rhythm:Regular Rate:Normal     Neuro/Psych negative neurological ROS  negative psych ROS   GI/Hepatic Neg liver ROS, GERD  ,  Endo/Other  negative endocrine ROS  Renal/GU negative Renal ROS  negative genitourinary   Musculoskeletal negative musculoskeletal ROS (+)   Abdominal   Peds  Hematology negative hematology ROS (+)   Anesthesia Other Findings   Reproductive/Obstetrics (+) Pregnancy Previous C/Section Desires sterilization                            Anesthesia Physical Anesthesia Plan  ASA: II  Anesthesia Plan: Spinal   Post-op Pain Management:    Induction:   Airway Management Planned: Natural Airway  Additional Equipment:   Intra-op Plan:   Post-operative Plan:   Informed Consent: I have reviewed the patients History and Physical, chart, labs and discussed the procedure including the risks, benefits and alternatives for the proposed anesthesia with the patient or authorized representative who has indicated his/her understanding and acceptance.     Plan Discussed with: Anesthesiologist, CRNA and Surgeon  Anesthesia Plan Comments:         Anesthesia Quick Evaluation

## 2015-03-30 NOTE — Anesthesia Postprocedure Evaluation (Signed)
  Anesthesia Post-op Note  Patient: Katrina Duncan  Procedure(s) Performed: Procedure(s): CESAREAN SECTION (N/A)  Patient Location: PACU  Anesthesia Type:Spinal  Level of Consciousness: awake, alert  and oriented  Airway and Oxygen Therapy: Patient Spontanous Breathing  Post-op Pain: none  Post-op Assessment: Post-op Vital signs reviewed, Patient's Cardiovascular Status Stable, Respiratory Function Stable, Patent Airway, No signs of Nausea or vomiting, Pain level controlled, No headache, No backache and Spinal receding              Post-op Vital Signs: Reviewed and stable  Last Vitals:  Filed Vitals:   03/30/15 1000  BP: 102/74  Pulse: 69  Temp:   Resp: 13    Complications: No apparent anesthesia complications

## 2015-03-31 LAB — CBC
HEMATOCRIT: 27.8 % — AB (ref 36.0–46.0)
Hemoglobin: 9.4 g/dL — ABNORMAL LOW (ref 12.0–15.0)
MCH: 32.3 pg (ref 26.0–34.0)
MCHC: 33.8 g/dL (ref 30.0–36.0)
MCV: 95.5 fL (ref 78.0–100.0)
PLATELETS: 99 10*3/uL — AB (ref 150–400)
RBC: 2.91 MIL/uL — AB (ref 3.87–5.11)
RDW: 13.2 % (ref 11.5–15.5)
WBC: 11.2 10*3/uL — ABNORMAL HIGH (ref 4.0–10.5)

## 2015-03-31 MED ORDER — TRAMADOL HCL 50 MG PO TABS
50.0000 mg | ORAL_TABLET | Freq: Four times a day (QID) | ORAL | Status: DC
  Administered 2015-03-31 – 2015-04-01 (×3): 50 mg via ORAL
  Filled 2015-03-31 (×3): qty 1

## 2015-03-31 NOTE — Progress Notes (Signed)
Subjective: [name] reports tolerating PO, + flatus and no problems voiding.  Bonding well with baby. Ambulated hallways several times yesterday. Feels well. Considering discharge home tomorrow  Objective: Vital signs in last 24 hours: Temp:  [97.7 F (36.5 C)-98.8 F (37.1 C)] 98.8 F (37.1 C) (10/29 0915) Pulse Rate:  [61-92] 92 (10/29 0915) Resp:  [12-18] 18 (10/29 0915) BP: (92-116)/(56-85) 101/58 mmHg (10/29 0915) SpO2:  [96 %-100 %] 97 % (10/29 0200)  Physical Exam:  General: alert, cooperative and no distress Lochia: appropriate Uterine Fundus: firm Incision: healing well, no significant drainage DVT Evaluation: No significant calf/ankle edema.   Recent Labs  03/30/15 0715 03/31/15 0555  HGB 12.2 9.4*  HCT 35.6* 27.8*    Assessment/Plan: Status post Cesarean section. Doing well postoperatively.  Continue current care.  Katrina Duncan 03/31/2015, 10:03 AM

## 2015-03-31 NOTE — Progress Notes (Signed)
Dr Mindi SlickerBanga notified that pt is requesting ultram for pain instead of percocet.  Pt states that the percocet makes her have nausea and vomiting

## 2015-04-01 DIAGNOSIS — Z9851 Tubal ligation status: Secondary | ICD-10-CM

## 2015-04-01 MED ORDER — TRAMADOL HCL 50 MG PO TABS: 50.0000 mg | ORAL_TABLET | Freq: Four times a day (QID) | ORAL | Status: DC

## 2015-04-01 MED ORDER — IBUPROFEN 600 MG PO TABS: 600.0000 mg | ORAL_TABLET | Freq: Four times a day (QID) | ORAL | Status: DC | PRN

## 2015-04-01 NOTE — Lactation Note (Signed)
This note was copied from the chart of Katrina Cathie HoopsChristina Blossom. Lactation Consultation Note  Patient Name: Katrina Cathie HoopsChristina Royster [id number]'s Date: 04/01/2015 Reason for consult: Follow-up assessment   With this mom of a term baby, now 5048 hours old. Mom reports breast feeding going well, and denies and questions/concerns. i did give mom, a [employer] employee, and new DEP.   Maternal Data    Feeding Feeding Type: Breast Fed  LATCH Score/Interventions                      Lactation Tools Discussed/Used     Consult Status Consult Status: Complete Follow-up type: Call as needed    Alfred LevinsLee, Journe Hallmark Anne 04/01/2015, 9:33 AM

## 2015-04-01 NOTE — Discharge Summary (Signed)
OB Discharge Summary     Patient Name: Katrina DellenChristina P Grealish DOB: 11/28/1980 MRN: [id number]  Date of admission: 03/30/2015 Delivering MD: Pryor OchoaBANGA, CECILIA [facility]   Date of discharge: 04/01/2015  Admitting diagnosis: 38 WEEKS CTX Intrauterine pregnancy: [id number]     Secondary diagnosis:  Active Problems:   Delayed delivery after SROM (spontaneous rupture of membranes)antepart   Status post repeat low transverse cesarean section   Postpartum care following cesarean delivery   History of bilateral tubal ligation  Additional problems: none     Discharge diagnosis: Term Pregnancy Delivered        + BTL                                                                                        Post partum procedures:none  Augmentation: none  Complications: None  Hospital course:  SROM at term and repeat cesarean section and bilateral tubal ligation  Physical exam  Filed Vitals:   03/31/15 0627 03/31/15 0915 03/31/15 1900 04/01/15 0542  BP: 92/56 101/58 105/60 117/70  Pulse: 75 92 79 92  Temp: 98.8 F (37.1 C) 98.8 F (37.1 C) 98.5 F (36.9 C) 98.4 F (36.9 C)  TempSrc: Oral  Oral Oral  Resp: 16 18 18 18   Height:      Weight:      SpO2:   98%    General: alert, cooperative and no distress Lochia: appropriate Uterine Fundus: firm Incision: Healing well with no significant drainage DVT Evaluation: Negative Homan's sign. No significant calf/ankle edema. Labs: Lab Results  Component Value Date   WBC 11.2* 03/31/2015   HGB 9.4* 03/31/2015   HCT 27.8* 03/31/2015   MCV 95.5 03/31/2015   PLT 99* 03/31/2015   No flowsheet data found.  Discharge instruction: per After Visit Summary and "Baby and Me Booklet".  Medications:  Current facility-administered medications:  .  acetaminophen (TYLENOL) tablet 650 mg, 650 mg, Oral, Q4H PRN, Cecilia Worema Banga, DO, 650 mg at 03/31/15 1458 .  witch hazel-glycerin (TUCKS) pad 1 application, 1 application, Topical, PRN **AND**  dibucaine (NUPERCAINAL) 1 % rectal ointment 1 application, 1 application, Rectal, PRN, Cecilia Worema Banga, DO .  diphenhydrAMINE (BENADRYL) injection 12.5 mg, 12.5 mg, Intravenous, Q4H PRN **OR** diphenhydrAMINE (BENADRYL) capsule 25 mg, 25 mg, Oral, Q4H PRN, Mal AmabileMichael Foster, MD .  diphenhydrAMINE (BENADRYL) capsule 25 mg, 25 mg, Oral, Q6H PRN, Cecilia Worema Banga, DO .  ibuprofen (ADVIL,MOTRIN) tablet 600 mg, 600 mg, Oral, Q6H PRN, Mal AmabileMichael Foster, MD, 600 mg at 03/31/15 0610 .  ibuprofen (ADVIL,MOTRIN) tablet 600 mg, 600 mg, Oral, 4 times per day, [facility] Bang[facility], DO, 600 mg at 04/01/15 0554 .  lactated ringers infusion, , Intravenous, Continuous, Cecilia Worema Banga, DO, Last Rate: 125 mL/hr at 03/30/15 1746 .  lanolin ointment 1 application, 1 application, Topical, PRN, Cecilia Worema Banga, DO .  menthol-cetylpyridinium (CEPACOL) lozenge 3 mg, 1 lozenge, Oral, Q2H PRN, Cecilia Worema Banga, DO .  nalbuphine (NUBAIN) injection 5 mg, 5 mg, Intravenous, Q4H PRN **OR** nalbuphine (NUBAIN) injection 5 mg, 5 mg, Subcutaneous, Q4H PRN, Mal AmabileMichael Foster, MD .  nalbuphine (NUBAIN) injection 5 mg, 5 mg,  Intravenous, Once PRN **OR** nalbuphine (NUBAIN) injection 5 mg, 5 mg, Subcutaneous, Once PRN, Mal Amabile, MD .  naloxone [facility]) 2 mg in dextrose 5 % 250 mL infusion, 1-4 mcg/kg/hr, Intravenous, Continuous PRN, Mal Amabile, MD .  naloxone [facility]) injection 0.4 mg, 0.4 mg, Intravenous, PRN **AND** sodium chloride 0.9 % injection 3 mL, 3 mL, Intravenous, PRN, Mal Amabile, MD, 3 mL at 03/31/15 0610 .  ondansetron (ZOFRAN) injection 4 mg, 4 mg, Intravenous, Q8H PRN, Mal Amabile, MD .  prenatal multivitamin tablet 1 tablet, 1 tablet, Oral, Q1200, Cecilia Worema Banga, DO, 1 tablet at 03/31/15 1139 .  scopolamine (TRANSDERM-SCOP) 1 MG/3DAYS 1.5 mg, 1 patch, Transdermal, Once, Mal Amabile, MD .  senna-docusate (Senokot-S) tablet 2 tablet, 2 tablet, Oral, Q24H, Cecilia Worema Banga, DO, 2  tablet at 04/01/15 0005 .  simethicone (MYLICON) chewable tablet 80 mg, 80 mg, Oral, TID PC, Cecilia Worema Banga, DO, 80 mg at 03/31/15 1729 .  simethicone (MYLICON) chewable tablet 80 mg, 80 mg, Oral, Q24H, Cecilia Worema Banga, DO, 80 mg at 04/01/15 0005 .  simethicone (MYLICON) chewable tablet 80 mg, 80 mg, Oral, PRN, Cecilia Worema Banga, DO .  traMADol (ULTRAM) tablet 50 mg, 50 mg, Oral, 4 times per day, Cecilia Worema Banga, DO, 50 mg at 04/01/15 0206 .  zolpidem (AMBIEN) tablet 5 mg, 5 mg, Oral, QHS PRN, Edwinna Areola, DO After visit meds:    Medication List    ASK your doctor about these medications        albuterol 108 (90 BASE) MCG/ACT inhaler  Commonly known as:  PROVENTIL HFA;VENTOLIN HFA  Inhale 2 puffs into the lungs every 6 (six) hours as needed for wheezing or shortness of breath.     cefdinir 300 MG capsule  Commonly known as:  OMNICEF  Take 1 capsule (300 mg total) by mouth 2 (two) times daily. 1 po BID     fluticasone 50 MCG/ACT nasal spray  Commonly known as:  FLONASE  Place 2 sprays into both nostrils daily.     multivitamin-prenatal 27-0.8 MG Tabs tablet  Take 1 tablet by mouth daily at 12 noon.        Diet: routine diet  Activity: Advance as tolerated. Pelvic rest for 6 weeks.   Outpatient follow up:2 weeks for incision check and 6weeks for postpartum visit Follow up Appt:No future appointments. Follow up Visit:No Follow-up on file.  Postpartum contraception: Tubal Ligation  Newborn Data: Live born female  Birth Weight: 8 lb 2 oz (3685 g) APGAR: 8, 9  Baby Feeding: Breast Disposition:home with mother   04/01/2015 [facility] [place], DO

## 2015-04-01 NOTE — Discharge Instructions (Signed)
Nothing in vagina for 6 weeks.  No sex, tampons, and douching. Lifting precautions no greater than 15 lbs. Keep legs elevated above chest if swelling noted.  Other instructions as in [facility] Discharge Booklet. °

## 2015-04-01 NOTE — Progress Notes (Signed)
Subjective: Postpartum Day 2: Cesarean Delivery Patient reports tolerating PO, + flatus, + BM and no problems voiding.  Denies any fever or chills. Requests early discharge today. No complaints  Objective: Vital signs in last 24 hours: Temp:  [98.4 F (36.9 C)-98.8 F (37.1 C)] 98.4 F (36.9 C) (10/30 0542) Pulse Rate:  [79-92] 92 (10/30 0542) Resp:  [18] 18 (10/30 0542) BP: (101-117)/(58-70) 117/70 mmHg (10/30 0542) SpO2:  [98 %] 98 % (10/29 1900)  Physical Exam:  General: alert, cooperative and no distress Lochia: appropriate Uterine Fundus: firm Incision: no significant drainage DVT Evaluation: No significant calf/ankle edema.   Recent Labs  03/30/15 0715 03/31/15 0555  HGB 12.2 9.4*  HCT 35.6* 27.8*    Assessment/Plan: Status post Cesarean section. Doing well postoperatively.  Discharge home with standard precautions and return to clinic in 2-6 weeks.  Katrina Duncan 04/01/2015, 8:17 AM

## 2015-04-02 NOTE — MAU Note (Signed)
Chart opened to capture missing changes from MAU visit of 03/30/15

## 2015-04-03 ENCOUNTER — Encounter (HOSPITAL_COMMUNITY): Payer: Self-pay | Admitting: Obstetrics and Gynecology

## 2015-04-04 ENCOUNTER — Other Ambulatory Visit (HOSPITAL_COMMUNITY): Payer: Self-pay

## 2015-04-06 ENCOUNTER — Inpatient Hospital Stay (HOSPITAL_COMMUNITY): Admission: RE | Admit: 2015-04-06 | Payer: 59 | Source: Ambulatory Visit | Admitting: Obstetrics and Gynecology

## 2015-04-06 ENCOUNTER — Encounter (HOSPITAL_COMMUNITY): Admission: RE | Payer: Self-pay | Source: Ambulatory Visit

## 2015-04-06 SURGERY — Surgical Case
Anesthesia: Regional | Laterality: Bilateral

## 2015-06-03 HISTORY — PX: UPPER GASTROINTESTINAL ENDOSCOPY: SHX188

## 2015-06-05 DIAGNOSIS — L509 Urticaria, unspecified: Secondary | ICD-10-CM | POA: Diagnosis not present

## 2015-06-18 DIAGNOSIS — L509 Urticaria, unspecified: Secondary | ICD-10-CM | POA: Diagnosis not present

## 2015-07-24 DIAGNOSIS — H52223 Regular astigmatism, bilateral: Secondary | ICD-10-CM | POA: Diagnosis not present

## 2015-07-24 DIAGNOSIS — H5213 Myopia, bilateral: Secondary | ICD-10-CM | POA: Diagnosis not present

## 2015-07-26 ENCOUNTER — Encounter: Payer: 59 | Admitting: Family Medicine

## 2015-08-21 DIAGNOSIS — Z01419 Encounter for gynecological examination (general) (routine) without abnormal findings: Secondary | ICD-10-CM | POA: Diagnosis not present

## 2015-08-23 ENCOUNTER — Encounter: Payer: 59 | Admitting: Family Medicine

## 2015-09-04 DIAGNOSIS — H04123 Dry eye syndrome of bilateral lacrimal glands: Secondary | ICD-10-CM | POA: Diagnosis not present

## 2015-09-04 DIAGNOSIS — H10413 Chronic giant papillary conjunctivitis, bilateral: Secondary | ICD-10-CM | POA: Diagnosis not present

## 2015-09-05 ENCOUNTER — Other Ambulatory Visit: Payer: Self-pay

## 2015-09-05 ENCOUNTER — Other Ambulatory Visit: Payer: 59

## 2015-09-05 DIAGNOSIS — Z299 Encounter for prophylactic measures, unspecified: Secondary | ICD-10-CM

## 2015-09-05 DIAGNOSIS — Z0000 Encounter for general adult medical examination without abnormal findings: Secondary | ICD-10-CM

## 2015-09-05 DIAGNOSIS — Z418 Encounter for other procedures for purposes other than remedying health state: Secondary | ICD-10-CM | POA: Diagnosis not present

## 2015-09-06 ENCOUNTER — Ambulatory Visit (INDEPENDENT_AMBULATORY_CARE_PROVIDER_SITE_OTHER): Payer: 59 | Admitting: Family Medicine

## 2015-09-06 ENCOUNTER — Encounter: Payer: Self-pay | Admitting: Family Medicine

## 2015-09-06 VITALS — BP 95/73 | HR 86 | Temp 97.1°F | Ht 61.0 in | Wt 124.0 lb

## 2015-09-06 DIAGNOSIS — Z0000 Encounter for general adult medical examination without abnormal findings: Secondary | ICD-10-CM

## 2015-09-06 LAB — CMP14+EGFR
ALT: 17 IU/L (ref 0–32)
AST: 15 IU/L (ref 0–40)
Albumin/Globulin Ratio: 1.9 (ref 1.2–2.2)
Albumin: 4.7 g/dL (ref 3.5–5.5)
Alkaline Phosphatase: 76 IU/L (ref 39–117)
BUN/Creatinine Ratio: 26 — ABNORMAL HIGH (ref 9–23)
BUN: 19 mg/dL (ref 6–20)
Bilirubin Total: 0.7 mg/dL (ref 0.0–1.2)
CO2: 25 mmol/L (ref 18–29)
Calcium: 9.4 mg/dL (ref 8.7–10.2)
Chloride: 101 mmol/L (ref 96–106)
Creatinine, Ser: 0.74 mg/dL (ref 0.57–1.00)
GFR calc Af Amer: 120 mL/min/1.73 (ref >59)
GFR calc non Af Amer: 104 mL/min/1.73 (ref >59)
Globulin, Total: 2.5 g/dL (ref 1.5–4.5)
Glucose: 78 mg/dL (ref 65–99)
Potassium: 4.4 mmol/L (ref 3.5–5.2)
Sodium: 140 mmol/L (ref 134–144)
Total Protein: 7.2 g/dL (ref 6.0–8.5)

## 2015-09-06 LAB — CBC WITH DIFFERENTIAL/PLATELET
Basophils Absolute: 0.1 x10E3/uL (ref 0.0–0.2)
Basos: 1 %
EOS (ABSOLUTE): 1 x10E3/uL — ABNORMAL HIGH (ref 0.0–0.4)
Eos: 13 %
Hematocrit: 42.4 % (ref 34.0–46.6)
Hemoglobin: 14 g/dL (ref 11.1–15.9)
Immature Grans (Abs): 0 x10E3/uL (ref 0.0–0.1)
Immature Granulocytes: 0 %
Lymphocytes Absolute: 2.5 x10E3/uL (ref 0.7–3.1)
Lymphs: 33 %
MCH: 30.6 pg (ref 26.6–33.0)
MCHC: 33 g/dL (ref 31.5–35.7)
MCV: 93 fL (ref 79–97)
Monocytes Absolute: 0.4 x10E3/uL (ref 0.1–0.9)
Monocytes: 5 %
Neutrophils Absolute: 3.7 x10E3/uL (ref 1.4–7.0)
Neutrophils: 48 %
Platelets: 224 x10E3/uL (ref 150–379)
RBC: 4.57 x10E6/uL (ref 3.77–5.28)
RDW: 13.3 % (ref 12.3–15.4)
WBC: 7.7 x10E3/uL (ref 3.4–10.8)

## 2015-09-06 LAB — LIPID PANEL
CHOLESTEROL TOTAL: 209 mg/dL — AB (ref 100–199)
Chol/HDL Ratio: 2.8 ratio units (ref 0.0–4.4)
HDL: 75 mg/dL (ref >39)
LDL Calculated: 120 mg/dL — ABNORMAL HIGH (ref 0–99)
TRIGLYCERIDES: 70 mg/dL (ref 0–149)
VLDL Cholesterol Cal: 14 mg/dL (ref 5–40)

## 2015-09-06 NOTE — Progress Notes (Signed)
 BP 95/73 mmHg  Pulse 86  Temp(Src) 97.1 F (36.2 C) (Oral)  Ht 5' 1" (1.549 m)  Wt 124 lb (56.246 kg)  BMI 23.44 kg/m2  Breastfeeding? Yes   Subjective:    Patient ID: Katrina Duncan, female    DOB: 06/03/1980, 35 y.o.   MRN: [id number]  HPI: Katrina Duncan is a 35 y.o. female presenting on 09/06/2015 for Annual Exam   HPI Adult well exam and labs Patient came in yesterday and did her labs is coming in today for her physical. She are he gets her Pap from her OB/GYN and got that just recently and it was normal. He denies any chest pain, shortness of breath, headaches or vision issues, abdominal complaints, diarrhea, nausea, vomiting, or joint issues.   Relevant past medical, surgical, family and social history reviewed and updated as indicated. Interim medical history since our last visit reviewed. Allergies and medications reviewed and updated.  Review of Systems  Constitutional: Negative for fever and chills.  HENT: Negative for congestion, ear discharge and ear pain.   Eyes: Negative for redness and visual disturbance.  Respiratory: Negative for chest tightness and shortness of breath.   Cardiovascular: Negative for chest pain and leg swelling.  Genitourinary: Negative for dysuria and difficulty urinating.  Musculoskeletal: Negative for back pain, arthralgias and gait problem.  Skin: Negative for color change and rash.  Neurological: Negative for dizziness, light-headedness and headaches.  Psychiatric/Behavioral: Negative for behavioral problems and agitation.  All other systems reviewed and are negative.   Per HPI unless specifically indicated above     Medication List       This list is accurate as of: 09/06/15  2:38 PM.  Always use your most recent med list.               CALCIUM 1200 PO  Take 1,200 mg by mouth daily.     multivitamin-prenatal 27-0.8 MG Tabs tablet  Take 1 tablet by mouth daily at 12 noon.           Objective:    BP 95/73  mmHg  Pulse 86  Temp(Src) 97.1 F (36.2 C) (Oral)  Ht 5' 1" (1.549 m)  Wt 124 lb (56.246 kg)  BMI 23.44 kg/m2  Breastfeeding? Yes  Wt Readings from Last 3 Encounters:  09/06/15 124 lb (56.246 kg)  03/30/15 155 lb (70.308 kg)  02/14/15 150 lb 3.2 oz (68.13 kg)    Physical Exam  Constitutional: She is oriented to person, place, and time. She appears well-developed and well-nourished. No distress.  HENT:  Right Ear: External ear normal.  Left Ear: External ear normal.  Nose: Nose normal.  Mouth/Throat: Oropharynx is clear and moist. No oropharyngeal exudate.  Eyes: Conjunctivae and EOM are normal. Pupils are equal, round, and reactive to light.  Neck: Neck supple. No thyromegaly present.  Cardiovascular: Normal rate, regular rhythm, normal heart sounds and intact distal pulses.   No murmur heard. Pulmonary/Chest: Effort normal and breath sounds normal. No respiratory distress. She has no wheezes.  Abdominal: Soft. Bowel sounds are normal. She exhibits no distension. There is no tenderness. There is no rebound and no guarding.  Musculoskeletal: Normal range of motion. She exhibits no edema or tenderness.  Lymphadenopathy:    She has no cervical adenopathy.  Neurological: She is alert and oriented to person, place, and time. Coordination normal.  Skin: Skin is warm and dry. No rash noted. She is not diaphoretic.  Psychiatric: She has a normal mood   and affect. Her behavior is normal. Judgment and thought content normal.  Nursing note and vitals reviewed.   Results for orders placed or performed in visit on 09/05/15  CMP14+EGFR  Result Value Ref Range   Glucose 78 65 - 99 mg/dL   BUN 19 6 - 20 mg/dL   Creatinine, Ser 0.74 0.57 - 1.00 mg/dL   GFR calc non Af Amer 104 >59 mL/min/1.73   GFR calc Af Amer 120 >59 mL/min/1.73   BUN/Creatinine Ratio 26 (H) 9 - 23   Sodium 140 134 - 144 mmol/L   Potassium 4.4 3.5 - 5.2 mmol/L   Chloride 101 96 - 106 mmol/L   CO2 25 18 - 29 mmol/L    Calcium 9.4 8.7 - 10.2 mg/dL   Total Protein 7.2 6.0 - 8.5 g/dL   Albumin 4.7 3.5 - 5.5 g/dL   Globulin, Total 2.5 1.5 - 4.5 g/dL   Albumin/Globulin Ratio 1.9 1.2 - 2.2   Bilirubin Total 0.7 0.0 - 1.2 mg/dL   Alkaline Phosphatase 76 39 - 117 IU/L   AST 15 0 - 40 IU/L   ALT 17 0 - 32 IU/L  CBC with Differential/Platelet  Result Value Ref Range   WBC 7.7 3.4 - 10.8 x10E3/uL   RBC 4.57 3.77 - 5.28 x10E6/uL   Hemoglobin 14.0 11.1 - 15.9 g/dL   Hematocrit 42.4 34.0 - 46.6 %   MCV 93 79 - 97 fL   MCH 30.6 26.6 - 33.0 pg   MCHC 33.0 31.5 - 35.7 g/dL   RDW 13.3 12.3 - 15.4 %   Platelets 224 150 - 379 x10E3/uL   Neutrophils 48 %   Lymphs 33 %   Monocytes 5 %   Eos 13 %   Basos 1 %   Neutrophils Absolute 3.7 1.4 - 7.0 x10E3/uL   Lymphocytes Absolute 2.5 0.7 - 3.1 x10E3/uL   Monocytes Absolute 0.4 0.1 - 0.9 x10E3/uL   EOS (ABSOLUTE) 1.0 (H) 0.0 - 0.4 x10E3/uL   Basophils Absolute 0.1 0.0 - 0.2 x10E3/uL   Immature Granulocytes 0 %   Immature Grans (Abs) 0.0 0.0 - 0.1 x10E3/uL  Lipid panel  Result Value Ref Range   Cholesterol, Total 209 (H) 100 - 199 mg/dL   Triglycerides 70 0 - 149 mg/dL   HDL 75 >39 mg/dL   VLDL Cholesterol Cal 14 5 - 40 mg/dL   LDL Calculated 120 (H) 0 - 99 mg/dL   Chol/HDL Ratio 2.8 0.0 - 4.4 ratio units      Assessment & Plan:   Problem List Items Addressed This Visit    None    Visit Diagnoses    Well adult exam    -  Primary       Follow up plan: Return in about 1 year (around 09/05/2016), or if symptoms worsen or fail to improve.  Counseling provided for all of the vaccine components No orders of the defined types were placed in this encounter.    Caryl Pina, MD [facility] 09/06/2015, 2:38 PM

## 2015-10-04 ENCOUNTER — Ambulatory Visit: Payer: Self-pay | Admitting: Pharmacist

## 2015-10-22 DIAGNOSIS — J101 Influenza due to other identified influenza virus with other respiratory manifestations: Secondary | ICD-10-CM | POA: Diagnosis not present

## 2015-10-22 DIAGNOSIS — R05 Cough: Secondary | ICD-10-CM | POA: Diagnosis not present

## 2015-11-05 ENCOUNTER — Encounter: Payer: Self-pay | Admitting: Physician Assistant

## 2015-11-05 ENCOUNTER — Ambulatory Visit (INDEPENDENT_AMBULATORY_CARE_PROVIDER_SITE_OTHER): Payer: 59 | Admitting: Physician Assistant

## 2015-11-05 VITALS — BP 113/77 | HR 83 | Temp 98.2°F | Ht 61.0 in | Wt 124.8 lb

## 2015-11-05 DIAGNOSIS — J069 Acute upper respiratory infection, unspecified: Secondary | ICD-10-CM

## 2015-11-05 NOTE — Patient Instructions (Signed)
Upper Respiratory Infection, Adult Most upper respiratory infections (URIs) are a viral infection of the air passages leading to the lungs. A URI affects the nose, throat, and upper air passages. The most common type of URI is nasopharyngitis and is typically referred to as "the common cold." URIs run their course and usually go away on their own. Most of the time, a URI does not require medical attention, but sometimes a bacterial infection in the upper airways can follow a viral infection. This is called a secondary infection. Sinus and middle ear infections are common types of secondary upper respiratory infections. Bacterial pneumonia can also complicate a URI. A URI can worsen asthma and chronic obstructive pulmonary disease (COPD). Sometimes, these complications can require emergency medical care and may be life threatening.  CAUSES Almost all URIs are caused by viruses. A virus is a type of germ and can spread from one person to another.  RISKS FACTORS You may be at risk for a URI if:   You smoke.   You have chronic heart or lung disease.  You have a weakened defense (immune) system.   You are very young or very old.   You have nasal allergies or asthma.  You work in crowded or poorly ventilated areas.  You work in health care facilities or schools. SIGNS AND SYMPTOMS  Symptoms typically develop 2-3 days after you come in contact with a cold virus. Most viral URIs last 7-10 days. However, viral URIs from the influenza virus (flu virus) can last 14-18 days and are typically more severe. Symptoms may include:   Runny or stuffy (congested) nose.   Sneezing.   Cough.   Sore throat.   Headache.   Fatigue.   Fever.   Loss of appetite.   Pain in your forehead, behind your eyes, and over your cheekbones (sinus pain).  Muscle aches.  DIAGNOSIS  Your health care provider may diagnose a URI by:  Physical exam.  Tests to check that your symptoms are not due to  another condition such as:  Strep throat.  Sinusitis.  Pneumonia.  Asthma. TREATMENT  A URI goes away on its own with time. It cannot be cured with medicines, but medicines may be prescribed or recommended to relieve symptoms. Medicines may help:  Reduce your fever.  Reduce your cough.  Relieve nasal congestion. HOME CARE INSTRUCTIONS   Take medicines only as directed by your health care provider.   Gargle warm saltwater or take cough drops to comfort your throat as directed by your health care provider.  Use a warm mist humidifier or inhale steam from a shower to increase air moisture. This may make it easier to breathe.  Drink enough fluid to keep your urine clear or pale yellow.   Eat soups and other clear broths and maintain good nutrition.   Rest as needed.   Return to work when your temperature has returned to normal or as your health care provider advises. You may need to stay home longer to avoid infecting others. You can also use a face mask and careful hand washing to prevent spread of the virus.  Increase the usage of your inhaler if you have asthma.   Do not use any tobacco products, including cigarettes, chewing tobacco, or electronic cigarettes. If you need help quitting, ask your health care provider. PREVENTION  The best way to protect yourself from getting a cold is to practice good hygiene.   Avoid oral or hand contact with people with cold   symptoms.   Wash your hands often if contact occurs.  There is no clear evidence that vitamin C, vitamin E, echinacea, or exercise reduces the chance of developing a cold. However, it is always recommended to get plenty of rest, exercise, and practice good nutrition.  SEEK MEDICAL CARE IF:   You are getting worse rather than better.   Your symptoms are not controlled by medicine.   You have chills.  You have worsening shortness of breath.  You have brown or red mucus.  You have yellow or brown nasal  discharge.  You have pain in your face, especially when you bend forward.  You have a fever.  You have swollen neck glands.  You have pain while swallowing.  You have white areas in the back of your throat. SEEK IMMEDIATE MEDICAL CARE IF:   You have severe or persistent:  Headache.  Ear pain.  Sinus pain.  Chest pain.  You have chronic lung disease and any of the following:  Wheezing.  Prolonged cough.  Coughing up blood.  A change in your usual mucus.  You have a stiff neck.  You have changes in your:  Vision.  Hearing.  Thinking.  Mood. MAKE SURE YOU:   Understand these instructions.  Will watch your condition.  Will get help right away if you are not doing well or get worse.   This information is not intended to replace advice given to you by your health care provider. Make sure you discuss any questions you have with your health care provider.   Document Released: 11/12/2000 Document Revised: 10/03/2014 Document Reviewed: 08/24/2013 Elsevier Interactive Patient Education 2016 [employer]  

## 2015-11-05 NOTE — Progress Notes (Signed)
Subjective:     Patient ID: Katrina DellenChristina P Duncan, female   DOB: Mar 11, 1981, 35 y.o.   MRN: [id number]  HPI Pt with recent confirmed flu Since she was breast feeding she held off on Tamiflu All of the sx improved and ~ 1 week later now with return of URI sx  Review of Systems  Constitutional: Positive for fever, activity change, appetite change and fatigue.  HENT: Positive for congestion, postnasal drip and sinus pressure. Negative for ear discharge, ear pain, nosebleeds, rhinorrhea, sneezing and sore throat.   Respiratory: Positive for cough. Negative for chest tightness and wheezing.   Cardiovascular: Negative.        Objective:   Physical Exam  Constitutional: She appears well-developed and well-nourished.  HENT:  Right Ear: External ear normal.  Left Ear: External ear normal.  Nose: Nose normal.  Mouth/Throat: Oropharynx is clear and moist. No oropharyngeal exudate.  Neck: Neck supple.  Cardiovascular: Normal rate, regular rhythm and normal heart sounds.   No murmur heard. Pulmonary/Chest: Effort normal and breath sounds normal. She has no wheezes.  Lymphadenopathy:    She has no cervical adenopathy.  Nursing note and vitals reviewed.      Assessment:     1. Acute upper respiratory infection        Plan:     Fluids Rest Transmission precautions reviewed Discussed ATB with pt and she would like to hold off for now If sx continue she will call before the weekend

## 2015-11-14 ENCOUNTER — Encounter: Payer: Self-pay | Admitting: Physician Assistant

## 2015-11-14 ENCOUNTER — Ambulatory Visit (INDEPENDENT_AMBULATORY_CARE_PROVIDER_SITE_OTHER): Payer: 59 | Admitting: Physician Assistant

## 2015-11-14 VITALS — BP 100/67 | HR 75 | Temp 97.3°F | Ht 61.0 in | Wt 125.0 lb

## 2015-11-14 DIAGNOSIS — M545 Low back pain, unspecified: Secondary | ICD-10-CM

## 2015-11-14 MED ORDER — MELOXICAM 15 MG PO TABS: 15.0000 mg | ORAL_TABLET | Freq: Every day | ORAL | Status: DC

## 2015-11-14 MED ORDER — CYCLOBENZAPRINE HCL 10 MG PO TABS: 10.0000 mg | ORAL_TABLET | Freq: Three times a day (TID) | ORAL | Status: DC | PRN

## 2015-11-14 NOTE — Progress Notes (Signed)
Subjective:     Patient ID: Katrina Duncan, female   DOB: 1981-04-14, 35 y.o.   MRN: [id number]  HPI Pt with intermit R lower back pain that has been increasing over the last week Denies any definite injury but has tried to increase her exercise level No radiation of sx to the hips/legs  Review of Systems + pain to the lower back No pain or numbness to the hip or legs No change in bowel/bladder function    Objective:   Physical Exam NAD Gait nl No ecchy/edema seen No palp spasm but TTP over the R SI joint FROM but increase in sx with Flex SLR neg Muscle strength distal good DTR 1+/= lower ext    Assessment:     1. Right-sided low back pain without sciatica        Plan:     Mobic 10mg  q d #10- Hold OTC NSAIDS Flexeril 10mg  tid prn #21- SE reviewed Heat Ice Massage Gentle stretching F/u prn

## 2015-11-14 NOTE — Patient Instructions (Signed)

## 2015-11-28 ENCOUNTER — Encounter: Payer: Self-pay | Admitting: Family Medicine

## 2015-11-28 ENCOUNTER — Ambulatory Visit (INDEPENDENT_AMBULATORY_CARE_PROVIDER_SITE_OTHER): Payer: 59 | Admitting: Family Medicine

## 2015-11-28 VITALS — BP 109/79 | HR 74 | Temp 97.5°F | Ht 61.0 in | Wt 120.6 lb

## 2015-11-28 DIAGNOSIS — R112 Nausea with vomiting, unspecified: Secondary | ICD-10-CM

## 2015-11-28 MED ORDER — ONDANSETRON HCL 4 MG PO TABS: 4.0000 mg | ORAL_TABLET | Freq: Three times a day (TID) | ORAL | Status: DC | PRN

## 2015-11-28 NOTE — Progress Notes (Signed)
   Subjective:    Patient ID: Katrina Duncan, female    DOB: May 08, 1981, 35 y.o.   MRN: [id number]  HPI patient has had nausea and vomited 1. She's had no real pain. She's had a little bit of lightheadedness but no true vertigo. She just finished breast-feeding and has her first period. She is status post tubal ligation and is certain she is not pregnant. Has no other symptoms of pregnant like breast soreness or fatigue fatigability. Did take anti-inflammatory for some back issues prior to onset denies any symptoms of burning or reflux. No increase stress but she is the mother of 2 young children and works in a stressful itself.  Patient Active Problem List   Diagnosis Date Noted  . History of bilateral tubal ligation 04/01/2015   Outpatient Encounter Prescriptions as of 11/28/2015  Medication Sig  . Calcium Carbonate-Vit D-Min (CALCIUM 1200 PO) Take 1,200 mg by mouth daily.  . [DISCONTINUED] cyclobenzaprine (FLEXERIL) 10 MG tablet Take 1 tablet (10 mg total) by mouth 3 (three) times daily as needed for muscle spasms.  . [DISCONTINUED] meloxicam (MOBIC) 15 MG tablet Take 1 tablet (15 mg total) by mouth daily.   No facility-administered encounter medications on file as of 11/28/2015.      Review of Systems  HENT: Negative.   Cardiovascular: Negative.   Gastrointestinal: Positive for nausea and vomiting.  Musculoskeletal: Negative.   Neurological: Negative.        Objective:   Physical Exam  Constitutional: She appears well-developed and well-nourished.  HENT:  Right Ear: External ear normal.  Left Ear: External ear normal.  Abdominal: Soft. There is no tenderness. There is no guarding.  Neurological: She is alert. Coordination normal.  Romberg and tandem gait are normal   BP 109/79 mmHg  Pulse 74  Temp(Src) 97.5 F (36.4 C) (Oral)  Ht 5\' 1"  (1.549 m)  Wt 120 lb 9.6 oz (54.704 kg)  BMI 22.80 kg/m2  LMP 11/25/2015        Assessment & Plan:  1. Nausea and vomiting,  intractability of vomiting not specified, unspecified vomiting type No obvious etiology here will assume she may have some gastritis from anti-inflammatory. Exam and history do not suggest gallbladder disease. We'll try Zofran 4 mg 3 times a day when necessary and also some Zantac over-the-counter for possible gastritis symptoms do not resolve when she returns from vacation next week return

## 2016-02-05 DIAGNOSIS — R112 Nausea with vomiting, unspecified: Secondary | ICD-10-CM | POA: Diagnosis not present

## 2016-02-05 DIAGNOSIS — K219 Gastro-esophageal reflux disease without esophagitis: Secondary | ICD-10-CM | POA: Diagnosis not present

## 2016-02-07 ENCOUNTER — Ambulatory Visit: Payer: Self-pay | Admitting: Family Medicine

## 2016-02-08 ENCOUNTER — Encounter: Payer: Self-pay | Admitting: Internal Medicine

## 2016-02-08 ENCOUNTER — Other Ambulatory Visit (HOSPITAL_COMMUNITY): Payer: Self-pay | Admitting: Family Medicine

## 2016-02-08 DIAGNOSIS — R112 Nausea with vomiting, unspecified: Secondary | ICD-10-CM

## 2016-02-20 ENCOUNTER — Ambulatory Visit: Payer: 59 | Admitting: Gastroenterology

## 2016-02-22 ENCOUNTER — Ambulatory Visit (HOSPITAL_COMMUNITY): Payer: 59

## 2016-02-22 ENCOUNTER — Ambulatory Visit (HOSPITAL_COMMUNITY)
Admission: RE | Admit: 2016-02-22 | Discharge: 2016-02-22 | Disposition: A | Discharge status: Home / Self Care | Payer: 59 | Source: Ambulatory Visit | Attending: Family Medicine | Admitting: Family Medicine

## 2016-02-22 DIAGNOSIS — R112 Nausea with vomiting, unspecified: Secondary | ICD-10-CM | POA: Insufficient documentation

## 2016-02-22 DIAGNOSIS — R1110 Vomiting, unspecified: Secondary | ICD-10-CM | POA: Diagnosis not present

## 2016-02-26 ENCOUNTER — Ambulatory Visit (INDEPENDENT_AMBULATORY_CARE_PROVIDER_SITE_OTHER): Payer: 59 | Admitting: Physician Assistant

## 2016-02-26 ENCOUNTER — Encounter: Payer: Self-pay | Admitting: *Deleted

## 2016-02-26 VITALS — BP 96/60 | HR 72 | Ht 61.0 in | Wt 122.1 lb

## 2016-02-26 DIAGNOSIS — K219 Gastro-esophageal reflux disease without esophagitis: Secondary | ICD-10-CM

## 2016-02-26 MED ORDER — FAMOTIDINE 20 MG PO TABS: 20.0000 mg | ORAL_TABLET | Freq: Two times a day (BID) | ORAL | 6 refills | Status: DC

## 2016-02-26 NOTE — Patient Instructions (Addendum)
We sent a prescription for Pepcid 20 mg to [facility] [address]  1. Pepcid 20 mg, Take 1 tab twice daily.  You have been scheduled for an endoscopy. Please follow written instructions given to you at your visit today. If you use inhalers (even only as needed), please bring them with you on the day of your procedure. Your physician has requested that you go to www.startemmi.com and enter the access code given to you at your visit today. This web site gives a general overview about your procedure. However, you should still follow specific instructions given to you by our office regarding your preparation for the procedure.

## 2016-02-26 NOTE — Progress Notes (Signed)
Subjective:    Patient ID: Katrina Duncan, female    DOB: [birth year], 35 y.o.   MRN: [id number]  HPI Katrina Duncan is a pleasant 35 year old white female [facility] Daryl Rhyne for evaluation of nausea and probable GERD. Patient says her current symptoms started in January 2017 when she had an episode of vomiting about 4 hours after  she had consumed a meal and one alcoholic beverage. A few weeks later she had another episode of vomiting after a glass of wine. At that point she didn't put herself on a course of omeprazole for 2 weeks which did seem to help. That pattern has continued with some nausea and vomiting being  expected whenever she has any alcohol. She says that seems to be associated with subxiphoid discomfort and a burning raw feeling in her throat which may last for a few days. She is has just completed a 20 day course of OTC and omeprazole and says that helped her symptoms quite a bit. Is not currently having any abdominal pain has no complaints of dysphagia. She had been taking NSAIDs several months ago but has not been on anything over the past few months. She says she has also started to notice that caffeine tends to aggravate her symptoms and has switched to decaffeinated coffee. Patient reports labs done by her PCP were unremarkable earlier in September and she also had upper abdominal ultrasound done on 02/22/2016 which was negative study.  Review of Systems Pertinent positive and negative review of systems were noted in the above HPI section.  All other review of systems was otherwise negative.  Outpatient Encounter Prescriptions as of 02/26/2016  Medication Sig  . Calcium Carbonate-Vit D-Min (CALCIUM 1200 PO) Take 1,200 mg by mouth daily.  . famotidine (PEPCID) 20 MG tablet Take 1 tablet (20 mg total) by mouth 2 (two) times daily.  . [DISCONTINUED] ondansetron (ZOFRAN) 4 MG tablet Take 1 tablet (4 mg total) by mouth 3 (three) times daily as needed for  nausea or vomiting.   No facility-administered encounter medications on file as of 02/26/2016.    Allergies  Allergen Reactions  . Penicillins Rash   Patient Active Problem List   Diagnosis Date Noted  . History of bilateral tubal ligation 04/01/2015   Social History   Social History  . Marital status: Married    Spouse name: N/A  . Number of children: 2  . Years of education: N/A   Occupational History  . physical therapist assistant    Social History Main Topics  . Smoking status: Never Smoker  . Smokeless tobacco: Never Used  . Alcohol use Yes     Comment: ocassional  . Drug use: No  . Sexual activity: Not on file   Other Topics Concern  . Not on file   Social History Narrative  . No narrative on file    Katrina Duncan's family history includes Breast cancer in her maternal grandmother and paternal grandmother; Lupus in her mother; Ovarian cancer in her mother; Sjogren's syndrome in her mother; Ulcerative colitis in her father.      Objective:    Vitals:   02/26/16 1006  BP: 96/60  Pulse: 72    Physical Exam  well-developed young white female in no acute distress, pleasant blood pressure 96/60 pulse 72 BMI 23.0. HEENT; nontraumatic normocephalic EOMI PERRLA sclera anicteric, Cardiovascular ;regular rate and rhythm with S1-S2 no murmur or gallop, Pulmonary ;clear bilaterally, Abdomen ;soft nontender nondistended bowel sounds are active  there is no palpable mass or hepatosplenomegaly.done, Extremities ;no clubbing cyanosis or edema skin warm and dry, Neuropsych; mood and affect appropriate       Assessment & Plan:   #76 35 year old white female with several month history of intermittent sour brash and what sounds like reflux esophagitis type symptoms with sporadic vomiting after consuming alcohol. I think her symptoms are consistent with acid reflux, less likely gastropathy or peptic ulcer disease and Upper abdominal ultrasound done last week  unremarkable.  Plan; strict anti-reflex regimen Patient reluctant to stay on long-term PPI, we'll send prescription for Pepcid 20 mg by mouth twice a day. She may also use this on an as-needed basis. Schedule for upper endoscopy with Dr. Yancey Flemings. Procedure discussed in detail with patient including risks and benefits and she is agreeable to proceed.   Maddax Palinkas Oswald Hillock PA-C 02/26/2016   Cc: Frederica Kuster, MD

## 2016-02-26 NOTE — Progress Notes (Signed)
Agree with initial assessment and plans per advanced practitioner

## 2016-03-24 ENCOUNTER — Encounter: Payer: 59 | Admitting: Internal Medicine

## 2016-03-26 ENCOUNTER — Ambulatory Visit (INDEPENDENT_AMBULATORY_CARE_PROVIDER_SITE_OTHER): Payer: 59 | Admitting: Family

## 2016-03-26 ENCOUNTER — Encounter: Payer: Self-pay | Admitting: Family

## 2016-03-26 VITALS — BP 94/71 | HR 77 | Temp 97.1°F | Ht 61.0 in | Wt 121.0 lb

## 2016-03-26 DIAGNOSIS — J301 Allergic rhinitis due to pollen: Secondary | ICD-10-CM

## 2016-03-26 DIAGNOSIS — J069 Acute upper respiratory infection, unspecified: Secondary | ICD-10-CM

## 2016-03-26 MED ORDER — CETIRIZINE HCL 10 MG PO TABS: 10.0000 mg | ORAL_TABLET | Freq: Every day | ORAL | 11 refills | Status: DC

## 2016-03-26 MED ORDER — PREDNISONE 10 MG (21) PO TBPK: ORAL_TABLET | ORAL | 0 refills | Status: DC

## 2016-03-26 MED ORDER — FLUTICASONE PROPIONATE 50 MCG/ACT NA SUSP: 2.0000 | Freq: Every day | NASAL | 6 refills | Status: DC

## 2016-03-26 NOTE — Patient Instructions (Signed)
Upper Respiratory Infection, Adult Most upper respiratory infections (URIs) are a viral infection of the air passages leading to the lungs. A URI affects the nose, throat, and upper air passages. The most common type of URI is nasopharyngitis and is typically referred to as "the common cold." URIs run their course and usually go away on their own. Most of the time, a URI does not require medical attention, but sometimes a bacterial infection in the upper airways can follow a viral infection. This is called a secondary infection. Sinus and middle ear infections are common types of secondary upper respiratory infections. Bacterial pneumonia can also complicate a URI. A URI can worsen asthma and chronic obstructive pulmonary disease (COPD). Sometimes, these complications can require emergency medical care and may be life threatening.  CAUSES Almost all URIs are caused by viruses. A virus is a type of germ and can spread from one person to another.  RISKS FACTORS You may be at risk for a URI if:   You smoke.   You have chronic heart or lung disease.  You have a weakened defense (immune) system.   You are very young or very old.   You have nasal allergies or asthma.  You work in crowded or poorly ventilated areas.  You work in health care facilities or schools. SIGNS AND SYMPTOMS  Symptoms typically develop 2-3 days after you come in contact with a cold virus. Most viral URIs last 7-10 days. However, viral URIs from the influenza virus (flu virus) can last 14-18 days and are typically more severe. Symptoms may include:   Runny or stuffy (congested) nose.   Sneezing.   Cough.   Sore throat.   Headache.   Fatigue.   Fever.   Loss of appetite.   Pain in your forehead, behind your eyes, and over your cheekbones (sinus pain).  Muscle aches.  DIAGNOSIS  Your health care provider may diagnose a URI by:  Physical exam.  Tests to check that your symptoms are not due to  another condition such as:  Strep throat.  Sinusitis.  Pneumonia.  Asthma. TREATMENT  A URI goes away on its own with time. It cannot be cured with medicines, but medicines may be prescribed or recommended to relieve symptoms. Medicines may help:  Reduce your fever.  Reduce your cough.  Relieve nasal congestion. HOME CARE INSTRUCTIONS   Take medicines only as directed by your health care provider.   Gargle warm saltwater or take cough drops to comfort your throat as directed by your health care provider.  Use a warm mist humidifier or inhale steam from a shower to increase air moisture. This may make it easier to breathe.  Drink enough fluid to keep your urine clear or pale yellow.   Eat soups and other clear broths and maintain good nutrition.   Rest as needed.   Return to work when your temperature has returned to normal or as your health care provider advises. You may need to stay home longer to avoid infecting others. You can also use a face mask and careful hand washing to prevent spread of the virus.  Increase the usage of your inhaler if you have asthma.   Do not use any tobacco products, including cigarettes, chewing tobacco, or electronic cigarettes. If you need help quitting, ask your health care provider. PREVENTION  The best way to protect yourself from getting a cold is to practice good hygiene.   Avoid oral or hand contact with people with cold   symptoms.   Wash your hands often if contact occurs.  There is no clear evidence that vitamin C, vitamin E, echinacea, or exercise reduces the chance of developing a cold. However, it is always recommended to get plenty of rest, exercise, and practice good nutrition.  SEEK MEDICAL CARE IF:   You are getting worse rather than better.   Your symptoms are not controlled by medicine.   You have chills.  You have worsening shortness of breath.  You have brown or red mucus.  You have yellow or brown nasal  discharge.  You have pain in your face, especially when you bend forward.  You have a fever.  You have swollen neck glands.  You have pain while swallowing.  You have white areas in the back of your throat. SEEK IMMEDIATE MEDICAL CARE IF:   You have severe or persistent:  Headache.  Ear pain.  Sinus pain.  Chest pain.  You have chronic lung disease and any of the following:  Wheezing.  Prolonged cough.  Coughing up blood.  A change in your usual mucus.  You have a stiff neck.  You have changes in your:  Vision.  Hearing.  Thinking.  Mood. MAKE SURE YOU:   Understand these instructions.  Will watch your condition.  Will get help right away if you are not doing well or get worse.   This information is not intended to replace advice given to you by your health care provider. Make sure you discuss any questions you have with your health care provider.   Document Released: 11/12/2000 Document Revised: 10/03/2014 Document Reviewed: 08/24/2013 Elsevier Interactive Patient Education 2016 [employer]  

## 2016-03-26 NOTE — Progress Notes (Signed)
Subjective:    Patient ID: Katrina Duncan, female    DOB: 30-Jul-1980, 35 y.o.   MRN: [id number]  Sinus Problem  This is a new problem. The current episode started 1 to 4 weeks ago. The problem has been waxing and waning since onset. There has been no fever. Her pain is at a severity of 0/10. She is experiencing no pain. Associated symptoms include congestion, coughing, ear pain, a hoarse voice and sneezing. Pertinent negatives include no chills, diaphoresis, headaches, shortness of breath, sinus pressure, sore throat or swollen glands. Past treatments include lying down and oral decongestants. The treatment provided mild relief.  Cough  Associated symptoms include ear pain. Pertinent negatives include no chills, headaches, sore throat or shortness of breath.      Review of Systems  Constitutional: Negative for chills and diaphoresis.  HENT: Positive for congestion, ear pain, hoarse voice and sneezing. Negative for sinus pressure and sore throat.   Respiratory: Positive for cough. Negative for shortness of breath.   Neurological: Negative for headaches.  All other systems reviewed and are negative.      Objective:   Physical Exam  Constitutional: She is oriented to person, place, and time. She appears well-developed and well-nourished. No distress.  HENT:  Head: Normocephalic and atraumatic.  Right Ear: External ear normal.  Left Ear: External ear normal.  Nasal passage erythemas with mild swelling   Eyes: Pupils are equal, round, and reactive to light.  Neck: Normal range of motion. Neck supple. No thyromegaly present.  Cardiovascular: Normal rate, regular rhythm, normal heart sounds and intact distal pulses.   No murmur heard. Pulmonary/Chest: Effort normal and breath sounds normal. No respiratory distress. She has no wheezes.  Abdominal: Soft. Bowel sounds are normal. She exhibits no distension. There is no tenderness.  Musculoskeletal: Normal range of motion. She  exhibits no edema or tenderness.  Neurological: She is alert and oriented to person, place, and time.  Skin: Skin is warm and dry.  Psychiatric: She has a normal mood and affect. Her behavior is normal. Judgment and thought content normal.  Vitals reviewed.     BP 94/71   Pulse 77   Temp 97.1 F (36.2 C) (Oral)   Ht 5\' 1"  (1.549 m)   Wt 121 lb (54.9 kg)   LMP 02/24/2016   BMI 22.86 kg/m      Assessment & Plan:  1. Acute upper respiratory infection -- Take meds as prescribed - Use a cool mist humidifier  -Use saline nose sprays frequently -Saline irrigations of the nose can be very helpful if done frequently.  * 4X daily for 1 week*  * Use of a nettie pot can be helpful with this. Follow directions with this* -Force fluids -For any cough or congestion  Use plain Mucinex- regular strength or max strength is fine   * Children- consult with Pharmacist for dosing -For fever or aces or pains- take tylenol or ibuprofen appropriate for age and weight.  * for fevers greater than 101 orally you may alternate ibuprofen and tylenol every  3 hours. -Throat lozenges if help - fluticasone (FLONASE) 50 MCG/ACT nasal spray; Place 2 sprays into both nostrils daily.  Dispense: 16 g; Refill: 6 - predniSONE (STERAPRED UNI-PAK 21 TAB) 10 MG (21) TBPK tablet; Use as directed  Dispense: 21 tablet; Refill: 0 - cetirizine (ZYRTEC) 10 MG tablet; Take 1 tablet (10 mg total) by mouth daily.  Dispense: 30 tablet; Refill: 11  2. Acute allergic rhinitis due  to pollen, unspecified seasonality - fluticasone (FLONASE) 50 MCG/ACT nasal spray; Place 2 sprays into both nostrils daily.  Dispense: 16 g; Refill: 6 - cetirizine (ZYRTEC) 10 MG tablet; Take 1 tablet (10 mg total) by mouth daily.  Dispense: 30 tablet; Refill: 11  Jannifer Rodneyhristy Deijah Spikes, FNP

## 2016-04-15 ENCOUNTER — Encounter: Payer: Self-pay | Admitting: Family Medicine

## 2016-04-17 ENCOUNTER — Ambulatory Visit: Payer: 59 | Admitting: Internal Medicine

## 2016-04-18 ENCOUNTER — Encounter: Payer: Self-pay | Admitting: Internal Medicine

## 2016-04-18 ENCOUNTER — Ambulatory Visit (AMBULATORY_SURGERY_CENTER): Payer: 59 | Admitting: Internal Medicine

## 2016-04-18 VITALS — BP 116/72 | HR 79 | Temp 98.4°F | Resp 13 | Ht 61.0 in | Wt 122.0 lb

## 2016-04-18 DIAGNOSIS — K219 Gastro-esophageal reflux disease without esophagitis: Secondary | ICD-10-CM | POA: Diagnosis not present

## 2016-04-18 DIAGNOSIS — K2100 Gastro-esophageal reflux disease with esophagitis, without bleeding: Secondary | ICD-10-CM

## 2016-04-18 MED ORDER — SODIUM CHLORIDE 0.9 % IV SOLN: 500.0000 mL | INTRAVENOUS | Status: DC

## 2016-04-18 NOTE — Progress Notes (Signed)
Teeth unchanged after procedure.A and O x3. Report to RN. Tolerated MAC anesthesia well. 

## 2016-04-18 NOTE — Op Note (Signed)
[facility] Patient Name: Katrina HoopsChristina Duncan Procedure Date: 04/18/2016 10:18 AM MRN: [id number] Endoscopist: Wilhemina BonitoJohn N. Marina GoodellPerry , MD Age: 35 Referring MD:  Date of Birth: 02/26/1981 Gender: Female Account #: [id number] Procedure:                Upper GI endoscopy, with biopsy Indications:              Suspected esophageal reflux, Nausea with vomiting Medicines:                Monitored Anesthesia Care Procedure:                Pre-Anesthesia Assessment:                           - Prior to the procedure, a History and Physical                            was performed, and patient medications and                            allergies were reviewed. The patient's tolerance of                            previous anesthesia was also reviewed. The risks                            and benefits of the procedure and the sedation                            options and risks were discussed with the patient.                            All questions were answered, and informed consent                            was obtained. Prior Anticoagulants: The patient has                            taken no previous anticoagulant or antiplatelet                            agents. ASA Grade Assessment: I - A normal, healthy                            patient. After reviewing the risks and benefits,                            the patient was deemed in satisfactory condition to                            undergo the procedure.                           After obtaining informed consent, the endoscope was  passed under direct vision. Throughout the                            procedure, the patient's blood pressure, pulse, and                            oxygen saturations were monitored continuously. The                            Model GIF-HQ190 [id number]) scope was introduced                            through the mouth, and advanced to the second part    of duodenum. The upper GI endoscopy was                            accomplished without difficulty. The patient                            tolerated the procedure well. Scope In: Scope Out: Findings:                 Mild reflux esophagitis was found as manifested by                            hyperemia of the mucosal Z line.                           A few small erosions were found in the stomach.                            Biopsies were taken with a cold forceps for                            Helicobacter pylori testing using CLOtest.                           The stomach was otherwise normal.                           The examined duodenum was normal.                           The cardia and gastric fundus were normal on                            retroflexion. Complications:            No immediate complications. Estimated Blood Loss:     Estimated blood loss: none. Impression:               1. GERD with mild esophagitis                           2. A few tiny gastric erosions. Otherwise normal  exam. Status post CLO biopsy to rule out H. pylori. Recommendation:           1. Reflux precautions                           2. We will contact you if the H. pylori biopsy is                            positive                           3. Consider more continuous and extended course of                            Prilosec (omeprazole) 20 mg daily to see if this                            impacts on your intermittent problems with nausea                            and vomiting.                           4. GI follow-up as needed Wilhemina BonitoJohn N. Marina GoodellPerry, MD 04/18/2016 10:45:34 AM This report has been signed electronically.

## 2016-04-18 NOTE — Patient Instructions (Addendum)
YOU HAD AN ENDOSCOPIC PROCEDURE TODAY AT THE [facility]:   Refer to the procedure report that was given to you for any specific questions about what was found during the examination.  If the procedure report does not answer your questions, please call your gastroenterologist to clarify.  If you requested that your care partner not be given the details of your procedure findings, then the procedure report has been included in a sealed envelope for you to review at your convenience later.  YOU SHOULD EXPECT: Some feelings of bloating in the abdomen. Passage of more gas than usual.  Walking can help get rid of the air that was put into your GI tract during the procedure and reduce the bloating. If you had a lower endoscopy (such as a colonoscopy or flexible sigmoidoscopy) you may notice spotting of blood in your stool or on the toilet paper. If you underwent a bowel prep for your procedure, you may not have a normal bowel movement for a few days.  Please Note:  You might notice some irritation and congestion in your nose or some drainage.  This is from the oxygen used during your procedure.  There is no need for concern and it should clear up in a day or so.  SYMPTOMS TO REPORT IMMEDIATELY:   Following upper endoscopy (EGD)  Vomiting of blood or coffee ground material  New chest pain or pain under the shoulder blades  Painful or persistently difficult swallowing  New shortness of breath  Fever of 100F or higher  Black, tarry-looking stools  For urgent or emergent issues, a gastroenterologist can be reached at any hour by calling [phone].  Please read all handouts given to you by your recovery RN. Continue Prilosec/omeprazole 20mg  daily for nausea and vomiting.   DIET:  We do recommend a small meal at first, but then you may proceed to your regular diet.  Drink plenty of fluids but you should avoid alcoholic beverages for 24 hours.  ACTIVITY:  You should plan to take it  easy for the rest of today and you should NOT DRIVE or use heavy machinery until tomorrow (because of the sedation medicines used during the test).    FOLLOW UP: Our staff will call the number listed on your records the next business day following your procedure to check on you and address any questions or concerns that you may have regarding the information given to you following your procedure. If we do not reach you, we will leave a message.  However, if you are feeling well and you are not experiencing any problems, there is no need to return our call.  We will assume that you have returned to your regular daily activities without incident.  If any biopsies were taken you will be contacted by phone or by letter within the next 1-3 weeks.  Please call us at [phone] if you have not heard about the biopsies in 3 weeks.    SIGNATURES/CONFIDENTIALITY: You and/or your care partner have signed paperwork which will be entered into your electronic medical record.  These signatures attest to the fact that that the information above on your After Visit Summary has been reviewed and is understood.  Full responsibility of the confidentiality of this discharge information lies with you and/or your care-partner.  Thank you for letting us take care of your healthcare needs today.

## 2016-04-18 NOTE — Progress Notes (Signed)
Called to room to assist during endoscopic procedure.  Patient ID and intended procedure confirmed with present staff. Received instructions for my participation in the procedure from the performing physician.  

## 2016-04-21 ENCOUNTER — Telehealth: Payer: Self-pay

## 2016-04-21 LAB — HELICOBACTER PYLORI SCREEN-BIOPSY: UREASE: NEGATIVE

## 2016-04-21 NOTE — Telephone Encounter (Signed)
  Follow up Call-  Call back number 04/18/2016  Post procedure Call Back phone  # [phone]  Permission to leave phone message Yes  Some recent data might be hidden     Patient questions:  Do you have a fever, pain , or abdominal swelling? No. Pain Score  0 *  Have you tolerated food without any problems? Yes.    Have you been able to return to your normal activities? Yes.    Do you have any questions about your discharge instructions: Diet   Yes.   Medications  No. Follow up visit  No.  Do you have questions or concerns about your Care? No.  Actions: * If pain score is 4 or above: No action needed, pain <4.

## 2016-05-01 ENCOUNTER — Telehealth: Payer: Self-pay | Admitting: Internal Medicine

## 2016-05-01 NOTE — Telephone Encounter (Signed)
Left message on machine to call back I do not see where anyone from our office has called the pt.

## 2016-05-05 NOTE — Telephone Encounter (Signed)
Pt did not return call. 

## 2016-08-01 DIAGNOSIS — S61210A Laceration without foreign body of right index finger without damage to nail, initial encounter: Secondary | ICD-10-CM | POA: Diagnosis not present

## 2016-08-19 DIAGNOSIS — H5213 Myopia, bilateral: Secondary | ICD-10-CM | POA: Diagnosis not present

## 2016-09-08 ENCOUNTER — Telehealth: Payer: Self-pay | Admitting: Internal Medicine

## 2016-09-08 NOTE — Telephone Encounter (Signed)
Pt states that after her procedure in November she took omeprazole otc for about a month and did fine. Reports that now different foods are bothering her again. Reports she is having problems especially at night with reflux and nausea. States she is also having diarrhea. Pt states that once she gets the reflux calmed down she has to stay away from certain foods. Pt thinks she may need to take something daily but really doesn't want to take a PPI due to possible side effects. Please advise.

## 2016-09-08 NOTE — Telephone Encounter (Signed)
Spoke with pt and she is aware.

## 2016-09-08 NOTE — Telephone Encounter (Signed)
She can try ranitidine 150 mg twice daily. If problems persist despite this, she should make a follow-up office appointment

## 2016-10-13 ENCOUNTER — Ambulatory Visit (INDEPENDENT_AMBULATORY_CARE_PROVIDER_SITE_OTHER): Payer: 59 | Admitting: Family Medicine

## 2016-10-13 ENCOUNTER — Encounter: Payer: Self-pay | Admitting: Family Medicine

## 2016-10-13 VITALS — BP 107/81 | HR 91 | Temp 98.9°F | Ht 61.0 in | Wt 117.0 lb

## 2016-10-13 DIAGNOSIS — M545 Low back pain, unspecified: Secondary | ICD-10-CM

## 2016-10-13 DIAGNOSIS — G8929 Other chronic pain: Secondary | ICD-10-CM

## 2016-10-13 MED ORDER — METHYLPREDNISOLONE ACETATE 80 MG/ML IJ SUSP
80.0000 mg | Freq: Once | INTRAMUSCULAR | Status: AC
  Administered 2016-10-13: 80 mg via INTRAMUSCULAR

## 2016-10-13 MED ORDER — TIZANIDINE HCL 4 MG PO TABS: 4.0000 mg | ORAL_TABLET | Freq: Four times a day (QID) | ORAL | 0 refills | Status: DC | PRN

## 2016-10-13 NOTE — Progress Notes (Signed)
BP 107/81   Pulse 91   Temp 98.9 F (37.2 C) (Oral)   Ht 5\' 1"  (1.549 m)   Wt 117 lb (53.1 kg)   BMI 22.11 kg/m    Subjective:    Patient ID: Katrina Duncan, female    DOB: January 20, 1981, 36 y.o.   MRN: [id number]  HPI: Katrina Duncan is a 36 y.o. female presenting on 10/13/2016 for Back Pain (lumbar, reoccuring for the last 18 months since she had her son, has not had CT or MRI)   HPI Low back pain, recurrent Patient has recurring lumbar back pain that has occurred 5 times in the past 18 months. When she has it is very debilitating and spasming and is mostly located in that right lower aspect of her back. Patient does work as a Management consultantphysical therapist technician and so she has done a lot of exercising and stretching and dry needling which does help things improve but then it has recurred 5 times over the past year. Today it is so severe that she had to have her daughter help her tired shoelaces and had a lot of trouble carrying her younger son to day care. She denies any fevers or chills or redness or warmth. She denies any pain shooting down her leg. She has been trying ibuprofen and Tylenol without much success. She has never had imaging or seen an orthopedic for this.  Relevant past medical, surgical, family and social history reviewed and updated as indicated. Interim medical history since our last visit reviewed. Allergies and medications reviewed and updated.  Review of Systems  Constitutional: Negative for chills and fever.  Respiratory: Negative for chest tightness and shortness of breath.   Cardiovascular: Negative for chest pain and leg swelling.  Musculoskeletal: Positive for back pain and myalgias. Negative for gait problem.  Skin: Negative for color change and rash.  Neurological: Negative for light-headedness and headaches.  Psychiatric/Behavioral: Negative for agitation and behavioral problems.  All other systems reviewed and are negative.   Per HPI  unless specifically indicated above     Objective:    BP 107/81   Pulse 91   Temp 98.9 F (37.2 C) (Oral)   Ht 5\' 1"  (1.549 m)   Wt 117 lb (53.1 kg)   BMI 22.11 kg/m   Wt Readings from Last 3 Encounters:  10/13/16 117 lb (53.1 kg)  04/18/16 122 lb (55.3 kg)  03/26/16 121 lb (54.9 kg)    Physical Exam  Constitutional: She is oriented to person, place, and time. She appears well-developed and well-nourished. No distress.  Eyes: Conjunctivae are normal.  Musculoskeletal: Normal range of motion. She exhibits tenderness. She exhibits no edema.       Lumbar back: She exhibits tenderness. She exhibits normal range of motion.       Back:  Neurological: She is alert and oriented to person, place, and time. Coordination normal.  Skin: Skin is warm and dry. No rash noted. She is not diaphoretic.  Psychiatric: She has a normal mood and affect. Her behavior is normal.  Nursing note and vitals reviewed.     Assessment & Plan:   Problem List Items Addressed This Visit    None    Visit Diagnoses    Chronic right-sided low back pain without sciatica    -  Primary   Relevant Medications   methylPREDNISolone acetate (DEPO-MEDROL) injection 80 mg (Start on 10/13/2016 11:45 AM)   tiZANidine (ZANAFLEX) 4 MG tablet   Other Relevant Orders  Ambulatory referral to Orthopedic Surgery      Follow up plan: Return if symptoms worsen or fail to improve.  Counseling provided for all of the vaccine components No orders of the defined types were placed in this encounter.   Arville Care, MD [facility] 10/13/2016, 11:35 AM

## 2016-11-24 ENCOUNTER — Telehealth: Payer: 59 | Admitting: Family

## 2016-11-24 DIAGNOSIS — K219 Gastro-esophageal reflux disease without esophagitis: Secondary | ICD-10-CM | POA: Diagnosis not present

## 2016-11-24 NOTE — Progress Notes (Signed)
Thank you for the details you put in the comment boxes. Those details really help us take better care of you.   For questions regarding your current treatment plan, please reach out to your prescribing provider. They are the most qualified to answer these questions. It appears you are being treated by [facility]. Please contact them.   Based on what you shared with me it looks like you have a serious condition that should be evaluated in a face to face office visit.  NOTE: Even if you have entered your credit card information for this eVisit, you will not be charged.   If you are having a true medical emergency please call 911.  If you need an urgent face to face visit, [facility] has four urgent care centers for your convenience.  If you need care fast and have a high deductible or no insurance consider:   [email or web address]  [phone]  [address], [address] [place], [place] [address] 8 am to 8 pm Monday-Friday 10 am to 4 pm Saturday-Sunday   The following sites will take your  insurance:    . [facility]  [phone] Get Driving Directions Find a Provider at this Location  [address] [place], [place] [address] . 10 am to 8 pm Monday-Friday . 12 pm to 8 pm Saturday-Sunday   . [facility]  [phone] Get Driving Directions Find a Provider at this Location  [address] [place], [place] [address] . 8 am to 8 pm Monday-Friday . 9 am to 6 pm Saturday . 11 am to 6 pm Sunday   . [facility]  [phone] Get Driving Directions  [address] [place], [place] [address] . 8 am to 8 pm Monday-Friday . 8 am to 4 pm Saturday-Sunday   Your e-visit answers were reviewed by a board certified advanced clinical practitioner to complete your personal care plan.  Thank you for using e-Visits.

## 2016-12-02 DIAGNOSIS — Z1389 Encounter for screening for other disorder: Secondary | ICD-10-CM | POA: Diagnosis not present

## 2016-12-02 DIAGNOSIS — Z6821 Body mass index (BMI) 21.0-21.9, adult: Secondary | ICD-10-CM | POA: Diagnosis not present

## 2016-12-02 DIAGNOSIS — Z124 Encounter for screening for malignant neoplasm of cervix: Secondary | ICD-10-CM | POA: Diagnosis not present

## 2016-12-02 DIAGNOSIS — Z01419 Encounter for gynecological examination (general) (routine) without abnormal findings: Secondary | ICD-10-CM | POA: Diagnosis not present

## 2016-12-02 DIAGNOSIS — Z130 Encounter for screening for diseases of the blood and blood-forming organs and certain disorders involving the immune mechanism: Secondary | ICD-10-CM | POA: Diagnosis not present

## 2016-12-25 ENCOUNTER — Ambulatory Visit (INDEPENDENT_AMBULATORY_CARE_PROVIDER_SITE_OTHER): Payer: 59 | Admitting: Family Medicine

## 2016-12-25 ENCOUNTER — Encounter: Payer: Self-pay | Admitting: Family Medicine

## 2016-12-25 VITALS — BP 105/76 | HR 71 | Temp 98.2°F | Ht 61.0 in | Wt 119.0 lb

## 2016-12-25 DIAGNOSIS — K2100 Gastro-esophageal reflux disease with esophagitis, without bleeding: Secondary | ICD-10-CM

## 2016-12-25 DIAGNOSIS — Z0000 Encounter for general adult medical examination without abnormal findings: Secondary | ICD-10-CM | POA: Diagnosis not present

## 2016-12-25 NOTE — Progress Notes (Signed)
BP 105/76   Pulse 71   Temp 98.2 F (36.8 C) (Oral)   Ht _0  (1.549 m)   Wt 119 lb (54 kg)   BMI 22.48 kg/m    Subjective:    Patient ID: Katrina Duncan, female    DOB: 1981-04-29, 36 y.o.   MRN: [id number]  HPI: Katrina Duncan is a 36 y.o. female presenting on 12/25/2016 for Annual Exam   HPI Well adult exam and GERD recheck Patient is coming in today for well adult exam and recheck on her acid reflux. She is trying to come down off her omeprazole but is struggling coming down off of it because she does not want to worry about the long-term osteoporotic effects of omeprazole. Patient denies any chest pain, shortness of breath, headaches or vision issues, abdominal complaints, diarrhea, nausea, vomiting, or joint issues.   Relevant past medical, surgical, family and social history reviewed and updated as indicated. Interim medical history since our last visit reviewed. Allergies and medications reviewed and updated.  Review of Systems  Constitutional: Negative for chills and fever.  HENT: Negative for congestion, ear discharge, ear pain and tinnitus.   Eyes: Negative for pain, redness and visual disturbance.  Respiratory: Negative for cough, chest tightness, shortness of breath and wheezing.   Cardiovascular: Negative for chest pain, palpitations and leg swelling.  Gastrointestinal: Negative for abdominal pain, blood in stool, constipation and diarrhea.  Genitourinary: Negative for difficulty urinating, dysuria and hematuria.  Musculoskeletal: Negative for back pain, gait problem and myalgias.  Skin: Negative for rash.  Neurological: Negative for dizziness, weakness, light-headedness and headaches.  Psychiatric/Behavioral: Negative for agitation, behavioral problems and suicidal ideas.  All other systems reviewed and are negative.   Per HPI unless specifically indicated above      Objective:    BP 105/76   Pulse 71   Temp 98.2 F (36.8 C) (Oral)    Ht _1  (1.549 m)   Wt 119 lb (54 kg)   BMI 22.48 kg/m   Wt Readings from Last 3 Encounters:  12/25/16 119 lb (54 kg)  10/13/16 117 lb (53.1 kg)  04/18/16 122 lb (55.3 kg)    Physical Exam  Constitutional: She is oriented to person, place, and time. She appears well-developed and well-nourished. No distress.  HENT:  Right Ear: External ear normal.  Left Ear: External ear normal.  Nose: Nose normal.  Mouth/Throat: Oropharynx is clear and moist. No oropharyngeal exudate.  Eyes: Conjunctivae are normal.  Neck: Neck supple. No thyromegaly present.  Cardiovascular: Normal rate, regular rhythm, normal heart sounds and intact distal pulses.   No murmur heard. Pulmonary/Chest: Effort normal and breath sounds normal. No respiratory distress. She has no wheezes.  Abdominal: Soft. Bowel sounds are normal. She exhibits no distension. There is no tenderness. There is no rebound and no guarding.  Musculoskeletal: Normal range of motion. She exhibits no edema or tenderness.  Lymphadenopathy:    She has no cervical adenopathy.  Neurological: She is alert and oriented to person, place, and time. Coordination normal.  Skin: Skin is warm and dry. No rash noted. She is not diaphoretic.  Psychiatric: She has a normal mood and affect. Her behavior is normal.  Nursing note and vitals reviewed.     Assessment & Plan:   Problem List Items Addressed This Visit    None    Visit Diagnoses    Well adult exam    -  Primary   Relevant Orders  CMP14+EGFR   CBC with Differential/Platelet   Lipid panel   Gastroesophageal reflux disease with esophagitis       Tried to stop omeprazole culture can get a lot of symptoms, is trying to wean herself off of it and I recommended using Zantac to help with some of the symptoms       Follow up plan: Return if symptoms worsen or fail to improve.  Counseling provided for all of the vaccine components Orders Placed This Encounter  Procedures  . CMP14+EGFR  .  CBC with Differential/Platelet  . Lipid panel    Caryl Pina, MD [facility] 12/25/2016, 2:36 PM

## 2016-12-26 LAB — CBC WITH DIFFERENTIAL/PLATELET
BASOS ABS: 0 10*3/uL (ref 0.0–0.2)
Basos: 1 %
EOS (ABSOLUTE): 0.8 10*3/uL — ABNORMAL HIGH (ref 0.0–0.4)
Eos: 9 %
HEMATOCRIT: 40.5 % (ref 34.0–46.6)
Hemoglobin: 13.4 g/dL (ref 11.1–15.9)
Immature Grans (Abs): 0 10*3/uL (ref 0.0–0.1)
Immature Granulocytes: 0 %
LYMPHS ABS: 2.7 10*3/uL (ref 0.7–3.1)
Lymphs: 32 %
MCH: 31.1 pg (ref 26.6–33.0)
MCHC: 33.1 g/dL (ref 31.5–35.7)
MCV: 94 fL (ref 79–97)
MONOS ABS: 0.5 10*3/uL (ref 0.1–0.9)
Monocytes: 6 %
NEUTROS ABS: 4.4 10*3/uL (ref 1.4–7.0)
Neutrophils: 52 %
Platelets: 246 10*3/uL (ref 150–379)
RBC: 4.31 x10E6/uL (ref 3.77–5.28)
RDW: 12.6 % (ref 12.3–15.4)
WBC: 8.4 10*3/uL (ref 3.4–10.8)

## 2016-12-26 LAB — CMP14+EGFR
A/G RATIO: 1.8 (ref 1.2–2.2)
ALBUMIN: 4.7 g/dL (ref 3.5–5.5)
ALK PHOS: 54 IU/L (ref 39–117)
ALT: 9 IU/L (ref 0–32)
AST: 17 IU/L (ref 0–40)
BILIRUBIN TOTAL: 0.4 mg/dL (ref 0.0–1.2)
BUN / CREAT RATIO: 21 (ref 9–23)
BUN: 17 mg/dL (ref 6–20)
CHLORIDE: 100 mmol/L (ref 96–106)
CO2: 25 mmol/L (ref 20–29)
Calcium: 9.2 mg/dL (ref 8.7–10.2)
Creatinine, Ser: 0.82 mg/dL (ref 0.57–1.00)
GFR calc Af Amer: 105 mL/min/{1.73_m2} (ref >59)
GFR calc non Af Amer: 91 mL/min/{1.73_m2} (ref >59)
Globulin, Total: 2.6 g/dL (ref 1.5–4.5)
Glucose: 77 mg/dL (ref 65–99)
Potassium: 4.1 mmol/L (ref 3.5–5.2)
Sodium: 138 mmol/L (ref 134–144)
Total Protein: 7.3 g/dL (ref 6.0–8.5)

## 2016-12-26 LAB — LIPID PANEL
CHOLESTEROL TOTAL: 220 mg/dL — AB (ref 100–199)
Chol/HDL Ratio: 2.9 ratio (ref 0.0–4.4)
HDL: 76 mg/dL (ref >39)
LDL Calculated: 84 mg/dL (ref 0–99)
TRIGLYCERIDES: 302 mg/dL — AB (ref 0–149)
VLDL Cholesterol Cal: 60 mg/dL — ABNORMAL HIGH (ref 5–40)

## 2017-06-17 ENCOUNTER — Encounter: Payer: Self-pay | Admitting: Family Medicine

## 2017-06-17 ENCOUNTER — Ambulatory Visit (INDEPENDENT_AMBULATORY_CARE_PROVIDER_SITE_OTHER): Payer: 59 | Admitting: Family Medicine

## 2017-06-17 VITALS — BP 107/78 | HR 86 | Temp 98.3°F | Ht 61.0 in | Wt 120.4 lb

## 2017-06-17 DIAGNOSIS — Z0000 Encounter for general adult medical examination without abnormal findings: Secondary | ICD-10-CM

## 2017-06-17 DIAGNOSIS — F419 Anxiety disorder, unspecified: Secondary | ICD-10-CM

## 2017-06-17 MED ORDER — BUPROPION HCL ER (XL) 150 MG PO TB24: 150.0000 mg | ORAL_TABLET | Freq: Every day | ORAL | 2 refills | Status: DC

## 2017-06-17 NOTE — Progress Notes (Signed)
BP 107/78   Pulse 86   Temp 98.3 F (36.8 C) (Oral)   Ht '5\' 1"'$  (1.549 m)   Wt 120 lb 6 oz (54.6 kg)   BMI 22.74 kg/m    Subjective:    Patient ID: Katrina Duncan, female    DOB: 1980/08/20, 37 y.o.   MRN: [id number]  HPI: Katrina Duncan is a 37 y.o. female presenting on 06/17/2017 for Follow-up   HPI Well adult exam and physical Patient is coming in today for well adult physical and exam and to discuss anxiety.  She says she is always been kind of anxious but has been building up to where she will wake up in the panics or sweats sometimes at night and she will get anxiety building up throughout the day as well.  She denies any chest pain but she does get some shortness of breath occasionally when she is in that panic attack state.  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  She denies any major feelings of depression or feeling down.  She denies any suicidal ideations or thoughts of hurting herself.  She denies any other major health issues.  She says her GERD and abdominal stuff is well under control. Patient denies any chest pain, shortness of breath, headaches or vision issues, abdominal complaints, diarrhea, nausea, vomiting, or joint issues.   Relevant past medical, surgical, family and social history reviewed and updated as indicated. Interim medical history since our last visit reviewed. Allergies and medications reviewed and updated.  Review of Systems  Constitutional: Negative for chills and fever.  HENT: Negative for congestion, ear discharge and ear pain.   Eyes: Negative for redness and visual disturbance.  Respiratory: Negative for chest tightness and shortness of breath.   Cardiovascular: Negative for chest pain and leg swelling.  Gastrointestinal: Negative for abdominal pain.  Genitourinary: Negative for difficulty urinating and dysuria.  Musculoskeletal: Negative for back pain and gait problem.  Skin: Negative for rash.  Neurological: Negative for  light-headedness and headaches.  Psychiatric/Behavioral: Negative for agitation, behavioral problems, decreased concentration, dysphoric mood, self-injury, sleep disturbance and suicidal ideas. The patient is nervous/anxious.   All other systems reviewed and are negative.   Per HPI unless specifically indicated above        Objective:    BP 107/78   Pulse 86   Temp 98.3 F (36.8 C) (Oral)   Ht '5\' 1"'$  (1.549 m)   Wt 120 lb 6 oz (54.6 kg)   BMI 22.74 kg/m   Wt Readings from Last 3 Encounters:  06/17/17 120 lb 6 oz (54.6 kg)  12/25/16 119 lb (54 kg)  10/13/16 117 lb (53.1 kg)    Physical Exam  Constitutional: She is oriented to person, place, and time. She appears well-developed and well-nourished. No distress.  Eyes: Conjunctivae are normal.  Neck: Neck supple. No thyromegaly present.  Cardiovascular: Normal rate, regular rhythm, normal heart sounds and intact distal pulses.  No murmur heard. Pulmonary/Chest: Effort normal and breath sounds normal. No respiratory distress. She has no wheezes. She has no rales.  Musculoskeletal: Normal range of motion. She exhibits no edema or tenderness.  Lymphadenopathy:    She has no cervical adenopathy.  Neurological: She is alert and oriented to person, place, and time. Coordination normal.  Skin: Skin is warm and dry. No rash noted. She is not diaphoretic.  Psychiatric: She has a normal mood and affect. Her behavior is normal.  Nursing note and vitals reviewed.  Assessment & Plan:   Problem List Items Addressed This Visit    None    Visit Diagnoses    Well adult exam    -  Primary   Relevant Orders   CMP14+EGFR   Lipid panel   CBC with Differential/Platelet   Anxiety       Relevant Medications   buPROPion (WELLBUTRIN XL) 150 MG 24 hr tablet      Follow up plan: Return in about 3 months (around 09/15/2017), or if symptoms worsen or fail to improve, for Anxiety recheck.  Counseling provided for all of the vaccine  components Orders Placed This Encounter  Procedures  . CMP14+EGFR  . Lipid panel  . CBC with Differential/Platelet    Caryl Pina, MD [facility] 06/17/2017, 2:09 PM

## 2017-06-18 ENCOUNTER — Other Ambulatory Visit: Payer: 59

## 2017-06-18 DIAGNOSIS — Z0000 Encounter for general adult medical examination without abnormal findings: Secondary | ICD-10-CM

## 2017-06-18 MED FILL — buPROPion HCL ER (XL) 150 M: 150 | 30 days supply | Qty: 30 | Fill #0

## 2017-06-18 MED FILL — SHIPPING COST: 1 days supply | Qty: 1 | Fill #0

## 2017-06-19 LAB — LIPID PANEL
CHOLESTEROL TOTAL: 219 mg/dL — AB (ref 100–199)
Chol/HDL Ratio: 2.4 ratio (ref 0.0–4.4)
HDL: 92 mg/dL (ref >39)
LDL CALC: 111 mg/dL — AB (ref 0–99)
TRIGLYCERIDES: 79 mg/dL (ref 0–149)
VLDL CHOLESTEROL CAL: 16 mg/dL (ref 5–40)

## 2017-06-19 LAB — CBC WITH DIFFERENTIAL/PLATELET
BASOS ABS: 0.1 10*3/uL (ref 0.0–0.2)
Basos: 1 %
EOS (ABSOLUTE): 0.6 10*3/uL — AB (ref 0.0–0.4)
Eos: 10 %
HEMOGLOBIN: 13.2 g/dL (ref 11.1–15.9)
Hematocrit: 40.4 % (ref 34.0–46.6)
IMMATURE GRANS (ABS): 0 10*3/uL (ref 0.0–0.1)
Immature Granulocytes: 0 %
LYMPHS: 35 %
Lymphocytes Absolute: 2.3 10*3/uL (ref 0.7–3.1)
MCH: 30.8 pg (ref 26.6–33.0)
MCHC: 32.7 g/dL (ref 31.5–35.7)
MCV: 94 fL (ref 79–97)
MONOCYTES: 7 %
Monocytes Absolute: 0.5 10*3/uL (ref 0.1–0.9)
NEUTROS ABS: 3 10*3/uL (ref 1.4–7.0)
Neutrophils: 47 %
PLATELETS: 233 10*3/uL (ref 150–379)
RBC: 4.28 x10E6/uL (ref 3.77–5.28)
RDW: 12.8 % (ref 12.3–15.4)
WBC: 6.5 10*3/uL (ref 3.4–10.8)

## 2017-06-19 LAB — CMP14+EGFR
ALBUMIN: 4.7 g/dL (ref 3.5–5.5)
ALT: 14 IU/L (ref 0–32)
AST: 16 IU/L (ref 0–40)
Albumin/Globulin Ratio: 1.7 (ref 1.2–2.2)
Alkaline Phosphatase: 49 IU/L (ref 39–117)
BUN / CREAT RATIO: 19 (ref 9–23)
BUN: 16 mg/dL (ref 6–20)
Bilirubin Total: 0.7 mg/dL (ref 0.0–1.2)
CO2: 21 mmol/L (ref 20–29)
CREATININE: 0.85 mg/dL (ref 0.57–1.00)
Calcium: 9.3 mg/dL (ref 8.7–10.2)
Chloride: 103 mmol/L (ref 96–106)
GFR calc non Af Amer: 87 mL/min/{1.73_m2} (ref >59)
GFR, EST AFRICAN AMERICAN: 100 mL/min/{1.73_m2} (ref >59)
GLOBULIN, TOTAL: 2.8 g/dL (ref 1.5–4.5)
GLUCOSE: 75 mg/dL (ref 65–99)
Potassium: 4.3 mmol/L (ref 3.5–5.2)
SODIUM: 142 mmol/L (ref 134–144)
TOTAL PROTEIN: 7.5 g/dL (ref 6.0–8.5)

## 2017-06-28 ENCOUNTER — Encounter: Payer: Self-pay | Admitting: Family Medicine

## 2017-07-08 ENCOUNTER — Encounter: Payer: Self-pay | Admitting: Family Medicine

## 2017-07-13 ENCOUNTER — Encounter: Payer: Self-pay | Admitting: Family Medicine

## 2017-07-13 ENCOUNTER — Telehealth: Payer: Self-pay | Admitting: Family Medicine

## 2017-07-13 NOTE — Telephone Encounter (Signed)
No answer, voicemail full jkp 2/11

## 2017-07-17 NOTE — Telephone Encounter (Signed)
Patient's concern was answered by email

## 2017-07-20 ENCOUNTER — Encounter: Payer: Self-pay | Admitting: Family Medicine

## 2017-07-20 MED ORDER — ESCITALOPRAM OXALATE 5 MG PO TABS: 5.0000 mg | ORAL_TABLET | Freq: Every day | ORAL | 1 refills | Status: DC

## 2017-07-20 MED FILL — ESCITALOPRAM 5 MG TABLET: 5 | 30 days supply | Qty: 30 | Fill #0

## 2017-07-22 MED FILL — SHIPPING COST: 1 days supply | Qty: 1 | Fill #1

## 2017-08-25 DIAGNOSIS — H5213 Myopia, bilateral: Secondary | ICD-10-CM | POA: Diagnosis not present

## 2017-09-16 ENCOUNTER — Encounter: Payer: Self-pay | Admitting: Family Medicine

## 2017-09-30 MED FILL — SHIPPING COST: 1 days supply | Qty: 1 | Fill #2

## 2017-09-30 MED FILL — ESCITALOPRAM 5 MG TABLET: 5 | 30 days supply | Qty: 30 | Fill #1

## 2017-10-21 ENCOUNTER — Encounter: Payer: Self-pay | Admitting: Family Medicine

## 2017-10-30 ENCOUNTER — Other Ambulatory Visit: Payer: Self-pay | Admitting: Family Medicine

## 2017-10-30 ENCOUNTER — Encounter: Payer: Self-pay | Admitting: Family Medicine

## 2017-11-02 MED ORDER — ESCITALOPRAM OXALATE 5 MG PO TABS: 5.0000 mg | ORAL_TABLET | Freq: Every day | ORAL | 1 refills | Status: DC

## 2017-11-02 MED FILL — ESCITALOPRAM 5 MG TABLET: 5 | 30 days supply | Qty: 30 | Fill #0

## 2017-11-02 MED FILL — SHIPPING COST: 1 days supply | Qty: 1 | Fill #3

## 2017-11-04 ENCOUNTER — Encounter: Payer: Self-pay | Admitting: Family Medicine

## 2017-11-04 MED ORDER — ESCITALOPRAM OXALATE 5 MG PO TABS: 5.0000 mg | ORAL_TABLET | Freq: Every day | ORAL | 1 refills | Status: DC

## 2017-11-11 MED ORDER — VILAZODONE HCL 10 & 20 MG PO KIT: 1.0000 | PACK | Freq: Every day | ORAL | 0 refills | Status: DC

## 2017-11-11 NOTE — Addendum Note (Signed)
Addended by: Arville CareETTINGER, JOSHUA on: 11/11/2017 07:58 AM   Modules accepted: Orders

## 2017-11-18 DIAGNOSIS — Z6821 Body mass index (BMI) 21.0-21.9, adult: Secondary | ICD-10-CM | POA: Diagnosis not present

## 2017-11-18 DIAGNOSIS — Z01419 Encounter for gynecological examination (general) (routine) without abnormal findings: Secondary | ICD-10-CM | POA: Diagnosis not present

## 2017-11-18 DIAGNOSIS — Z1389 Encounter for screening for other disorder: Secondary | ICD-10-CM | POA: Diagnosis not present

## 2017-11-18 DIAGNOSIS — Z124 Encounter for screening for malignant neoplasm of cervix: Secondary | ICD-10-CM | POA: Diagnosis not present

## 2017-11-19 DIAGNOSIS — Z124 Encounter for screening for malignant neoplasm of cervix: Secondary | ICD-10-CM | POA: Diagnosis not present

## 2017-11-30 MED FILL — ESCITALOPRAM 5 MG TABLET: 5 | 90 days supply | Qty: 90 | Fill #0

## 2017-11-30 MED FILL — SHIPPING COST: 1 days supply | Qty: 1 | Fill #4

## 2018-03-01 MED FILL — ESCITALOPRAM 5 MG TABLET: 5 | 90 days supply | Qty: 90 | Fill #1

## 2018-03-01 MED FILL — SHIPPING COST: 1 days supply | Qty: 1 | Fill #5

## 2018-05-24 ENCOUNTER — Other Ambulatory Visit: Payer: Self-pay | Admitting: Family Medicine

## 2018-05-24 MED FILL — SHIPPING COST: 1 days supply | Qty: 1 | Fill #6

## 2018-05-24 MED FILL — ESCITALOPRAM 5 MG TABLET: 5 | 90 days supply | Qty: 90 | Fill #0

## 2018-05-24 NOTE — Telephone Encounter (Signed)
Last seen 04/07/18  Dr [name] 

## 2018-08-17 ENCOUNTER — Other Ambulatory Visit: Payer: Self-pay | Admitting: Family Medicine

## 2018-08-17 MED FILL — ESCITALOPRAM 5 MG TABLET: 5 | 30 days supply | Qty: 30 | Fill #0

## 2018-09-27 ENCOUNTER — Other Ambulatory Visit: Payer: Self-pay | Admitting: Family Medicine

## 2018-09-27 MED FILL — ESCITALOPRAM 5 MG TABLET: 5 | 30 days supply | Qty: 30 | Fill #1

## 2018-10-26 ENCOUNTER — Other Ambulatory Visit: Payer: Self-pay | Admitting: Family Medicine

## 2018-10-26 ENCOUNTER — Encounter: Payer: Self-pay | Admitting: Family Medicine

## 2018-10-26 MED FILL — ESCITALOPRAM 5 MG TABLET: 5 | 30 days supply | Qty: 30 | Fill #0

## 2018-10-28 NOTE — Telephone Encounter (Signed)
[name]. NTBS 30 days given 09/27/18 

## 2018-10-28 NOTE — Telephone Encounter (Signed)
Follow up scheduled

## 2018-11-08 ENCOUNTER — Other Ambulatory Visit: Payer: Self-pay

## 2018-11-08 ENCOUNTER — Ambulatory Visit (INDEPENDENT_AMBULATORY_CARE_PROVIDER_SITE_OTHER): Payer: 59 | Admitting: Family Medicine

## 2018-11-08 ENCOUNTER — Encounter: Payer: Self-pay | Admitting: Family Medicine

## 2018-11-08 DIAGNOSIS — F419 Anxiety disorder, unspecified: Secondary | ICD-10-CM | POA: Diagnosis not present

## 2018-11-08 MED ORDER — VILAZODONE HCL 10 MG PO TABS: 10.0000 mg | ORAL_TABLET | Freq: Every day | ORAL | 1 refills | Status: DC

## 2018-11-08 MED FILL — VIIBRYD 10 MG TABLET: 10 | 30 days supply | Qty: 30 | Fill #0

## 2018-11-08 NOTE — Progress Notes (Signed)
Virtual Visit via telephone Note  I connected with Katrina Duncan on 11/08/18 at 1415 by telephone and verified that I am speaking with the correct person using two identifiers. Katrina Duncan is currently located at home and no other people are currently with her during visit. The provider, Fransisca Kaufmann [name], MD is located in their office at time of visit.  Call ended at 1427  I discussed the limitations, risks, security and privacy concerns of performing an evaluation and management service by telephone and the availability of in person appointments. I also discussed with the patient that there may be a patient responsible charge related to this service. The patient expressed understanding and agreed to proceed.   History and Present Illness: Patient has done well with the Lexapro except for the fact that she has significant decreased libido and that is where she wanted try something different.  She did not do well with the Wellbutrin and she has not tried anything else at this point.  We have discussed the [place] previously but she has not tried it to this point.  Patient denies any suicidal ideations or thoughts of hurting herself.  The coronavirus is definitely not helped having all of her kids mom has increased anxiety significantly.  No diagnosis found.  Outpatient Encounter Medications as of 11/08/2018  Medication Sig  . buPROPion (WELLBUTRIN XL) 150 MG 24 hr tablet Take 1 tablet (150 mg total) by mouth daily.  Marland Kitchen escitalopram (LEXAPRO) 5 MG tablet TAKE 1 TABLET BY MOUTH ONCE DAILY  . Vilazodone HCl (VIIBRYD STARTER PACK) 10 & 20 MG KIT Take 1 tablet by mouth daily. Follow instructions on packet   Facility-Administered Encounter Medications as of 11/08/2018  Medication  . 0.9 %  sodium chloride infusion    Review of Systems  Constitutional: Negative for chills and fever.  Eyes: Negative for visual disturbance.  Respiratory: Negative for chest tightness and  shortness of breath.   Cardiovascular: Negative for chest pain and leg swelling.  Musculoskeletal: Negative for back pain and gait problem.  Skin: Negative for rash.  Neurological: Negative for light-headedness and headaches.  Psychiatric/Behavioral: Positive for dysphoric mood. Negative for agitation, behavioral problems, self-injury, sleep disturbance and suicidal ideas. The patient is nervous/anxious.   All other systems reviewed and are negative.   Observations/Objective: Patient sounds comfortable and in no acute distress, crying babies are hurting back around  Assessment and Plan: Problem List Items Addressed This Visit    None    Visit Diagnoses    Anxiety    -  Primary   Relevant Medications   Vilazodone HCl (VIIBRYD) 10 MG TABS       Follow Up Instructions:  Recheck in 4 weeks to see how she is doing.  She can go ahead and stop the Lexapro and start the Viibryd and take it once a day every day.  Warned her about the stomach issues but usually taper off after the first week.   I discussed the assessment and treatment plan with the patient. The patient was provided an opportunity to ask questions and all were answered. The patient agreed with the plan and demonstrated an understanding of the instructions.   The patient was advised to call back or seek an in-person evaluation if the symptoms worsen or if the condition fails to improve as anticipated.  The above assessment and management plan was discussed with the patient. The patient verbalized understanding of and has agreed to the management plan. Patient is  aware to call the clinic if symptoms persist or worsen. Patient is aware when to return to the clinic for a follow-up visit. Patient educated on when it is appropriate to go to the emergency department.    I provided 12 minutes of non-face-to-face time during this encounter.    Worthy Rancher, MD

## 2018-11-09 DIAGNOSIS — H5213 Myopia, bilateral: Secondary | ICD-10-CM | POA: Diagnosis not present

## 2018-11-16 ENCOUNTER — Encounter: Payer: Self-pay | Admitting: Family Medicine

## 2018-11-26 DIAGNOSIS — Z130 Encounter for screening for diseases of the blood and blood-forming organs and certain disorders involving the immune mechanism: Secondary | ICD-10-CM | POA: Diagnosis not present

## 2018-11-26 DIAGNOSIS — Z8041 Family history of malignant neoplasm of ovary: Secondary | ICD-10-CM | POA: Diagnosis not present

## 2018-11-26 DIAGNOSIS — Z6823 Body mass index (BMI) 23.0-23.9, adult: Secondary | ICD-10-CM | POA: Diagnosis not present

## 2018-11-26 DIAGNOSIS — Z1231 Encounter for screening mammogram for malignant neoplasm of breast: Secondary | ICD-10-CM | POA: Diagnosis not present

## 2018-11-26 DIAGNOSIS — Z1389 Encounter for screening for other disorder: Secondary | ICD-10-CM | POA: Diagnosis not present

## 2018-11-26 DIAGNOSIS — Z124 Encounter for screening for malignant neoplasm of cervix: Secondary | ICD-10-CM | POA: Diagnosis not present

## 2018-11-26 DIAGNOSIS — Z01419 Encounter for gynecological examination (general) (routine) without abnormal findings: Secondary | ICD-10-CM | POA: Diagnosis not present

## 2018-12-01 ENCOUNTER — Encounter: Payer: Self-pay | Admitting: Family Medicine

## 2018-12-01 DIAGNOSIS — F419 Anxiety disorder, unspecified: Secondary | ICD-10-CM

## 2018-12-02 MED ORDER — VIIBRYD 10 MG PO TABS: 10.0000 mg | ORAL_TABLET | Freq: Every day | ORAL | 1 refills | Status: DC

## 2018-12-02 MED FILL — VIIBRYD 10 MG TABLET: 10 | 90 days supply | Qty: 90 | Fill #0

## 2018-12-15 ENCOUNTER — Encounter: Payer: Self-pay | Admitting: Family Medicine

## 2018-12-30 ENCOUNTER — Encounter: Payer: Self-pay | Admitting: Family Medicine

## 2019-02-03 ENCOUNTER — Encounter: Payer: 59 | Admitting: Family Medicine

## 2019-02-22 ENCOUNTER — Other Ambulatory Visit: Payer: Self-pay

## 2019-02-23 ENCOUNTER — Ambulatory Visit (INDEPENDENT_AMBULATORY_CARE_PROVIDER_SITE_OTHER): Payer: 59 | Admitting: Family Medicine

## 2019-02-23 ENCOUNTER — Encounter: Payer: Self-pay | Admitting: Family Medicine

## 2019-02-23 VITALS — BP 103/70 | HR 86 | Temp 98.4°F | Resp 18 | Ht 61.0 in | Wt 124.8 lb

## 2019-02-23 DIAGNOSIS — Z0000 Encounter for general adult medical examination without abnormal findings: Secondary | ICD-10-CM | POA: Diagnosis not present

## 2019-02-23 NOTE — Progress Notes (Signed)
BP 103/70   Pulse 86   Temp 98.4 F (36.9 C) (Temporal)   Resp 18   Ht _0  (1.549 m)   Wt 124 lb 12.8 oz (56.6 kg)   LMP 02/20/2019 (Approximate)   SpO2 99%   BMI 23.58 kg/m    Subjective:   Patient ID: Katrina Duncan, female    DOB: 06/22/1980, 38 y.o.   MRN: [id number]  HPI: Katrina Duncan is a 38 y.o. female presenting on 02/23/2019 for Annual Exam   HPI Patient is coming in for adult well exam and physical Patient is coming in for well adult exam and physical today.  She gets her Pap smears through her obstetrician and is up-to-date on that she just had her first mammogram earlier this year.  She denies any major health issues or concerns. Patient denies any chest pain, shortness of breath, headaches or vision issues, abdominal complaints, diarrhea, nausea, vomiting, or joint issues.   Relevant past medical, surgical, family and social history reviewed and updated as indicated. Interim medical history since our last visit reviewed. Allergies and medications reviewed and updated.  Review of Systems  Constitutional: Negative for chills and fever.  HENT: Negative for congestion, ear discharge, ear pain and tinnitus.   Eyes: Negative for pain, redness and visual disturbance.  Respiratory: Negative for cough, chest tightness, shortness of breath and wheezing.   Cardiovascular: Negative for chest pain, palpitations and leg swelling.  Gastrointestinal: Negative for abdominal pain, blood in stool, constipation and diarrhea.  Genitourinary: Negative for difficulty urinating, dysuria and hematuria.  Musculoskeletal: Negative for back pain, gait problem and myalgias.  Skin: Negative for rash.  Neurological: Negative for dizziness, weakness, light-headedness and headaches.  Psychiatric/Behavioral: Negative for agitation, behavioral problems and suicidal ideas.  All other systems reviewed and are negative.   Per HPI unless specifically indicated above    Allergies as of 02/23/2019      Reactions   Penicillins    hives   Wellbutrin [bupropion]       Medication List       Accurate as of February 23, 2019  2:20 PM. If you have any questions, ask your nurse or doctor.        STOP taking these medications   escitalopram 5 MG tablet Commonly known as: LEXAPRO Stopped by: Fransisca Kaufmann , MD   Viibryd 10 MG Tabs Generic drug: Vilazodone HCl Stopped by: Fransisca Kaufmann , MD        Objective:   BP 103/70   Pulse 86   Temp 98.4 F (36.9 C) (Temporal)   Resp 18   Ht _1  (1.549 m)   Wt 124 lb 12.8 oz (56.6 kg)   LMP 02/20/2019 (Approximate)   SpO2 99%   BMI 23.58 kg/m   Wt Readings from Last 3 Encounters:  02/23/19 124 lb 12.8 oz (56.6 kg)  06/17/17 120 lb 6 oz (54.6 kg)  12/25/16 119 lb (54 kg)    Physical Exam Vitals signs and nursing note reviewed.  Constitutional:      General: She is not in acute distress.    Appearance: She is well-developed. She is not diaphoretic.  Eyes:     Conjunctiva/sclera: Conjunctivae normal.     Pupils: Pupils are equal, round, and reactive to light.  Cardiovascular:     Rate and Rhythm: Normal rate and regular rhythm.     Heart sounds: Normal heart sounds. No murmur.  Pulmonary:     Effort: Pulmonary effort is  normal. No respiratory distress.     Breath sounds: Normal breath sounds. No wheezing.  Musculoskeletal: Normal range of motion.        General: No tenderness.  Skin:    General: Skin is warm and dry.     Findings: No rash.  Neurological:     Mental Status: She is alert and oriented to person, place, and time.     Coordination: Coordination normal.  Psychiatric:        Behavior: Behavior normal.       Assessment & Plan:   Problem List Items Addressed This Visit    None    Visit Diagnoses    Well adult exam    -  Primary   Relevant Orders   CBC with Differential/Platelet   CMP14+EGFR   Lipid panel      Continue routine wellness exams yearly  Follow up plan: Return in about 1 year (around 02/23/2020), or if symptoms worsen or fail to improve, for Well adult exam.  Counseling provided for all of the vaccine components No orders of the defined types were placed in this encounter.   Caryl Pina, MD [facility] 02/23/2019, 2:20 PM

## 2019-02-24 ENCOUNTER — Other Ambulatory Visit: Payer: 59

## 2019-02-24 DIAGNOSIS — Z0000 Encounter for general adult medical examination without abnormal findings: Secondary | ICD-10-CM | POA: Diagnosis not present

## 2019-02-25 LAB — LIPID PANEL
Chol/HDL Ratio: 2.9 ratio (ref 0.0–4.4)
Cholesterol, Total: 220 mg/dL — ABNORMAL HIGH (ref 100–199)
HDL: 76 mg/dL (ref >39)
LDL Chol Calc (NIH): 128 mg/dL — ABNORMAL HIGH (ref 0–99)
Triglycerides: 90 mg/dL (ref 0–149)
VLDL Cholesterol Cal: 16 mg/dL (ref 5–40)

## 2019-02-25 LAB — CMP14+EGFR
ALT: 14 IU/L (ref 0–32)
AST: 15 IU/L (ref 0–40)
Albumin/Globulin Ratio: 1.8 (ref 1.2–2.2)
Albumin: 4.6 g/dL (ref 3.8–4.8)
Alkaline Phosphatase: 55 IU/L (ref 39–117)
BUN/Creatinine Ratio: 20 (ref 9–23)
BUN: 17 mg/dL (ref 6–24)
Bilirubin Total: 0.5 mg/dL (ref 0.0–1.2)
CO2: 26 mmol/L (ref 20–29)
Calcium: 9.2 mg/dL (ref 8.7–10.2)
Chloride: 104 mmol/L (ref 96–106)
Creatinine, Ser: 0.84 mg/dL (ref 0.57–1.00)
GFR calc Af Amer: 101 mL/min/{1.73_m2} (ref >59)
GFR calc non Af Amer: 87 mL/min/{1.73_m2} (ref >59)
Globulin, Total: 2.6 g/dL (ref 1.5–4.5)
Glucose: 94 mg/dL (ref 65–99)
Potassium: 4.2 mmol/L (ref 3.5–5.2)
Sodium: 140 mmol/L (ref 134–144)
Total Protein: 7.2 g/dL (ref 6.0–8.5)

## 2019-02-25 LAB — CBC WITH DIFFERENTIAL/PLATELET
Basophils Absolute: 0.1 10*3/uL (ref 0.0–0.2)
Basos: 1 %
EOS (ABSOLUTE): 0.5 10*3/uL — ABNORMAL HIGH (ref 0.0–0.4)
Eos: 9 %
Hematocrit: 38.4 % (ref 34.0–46.6)
Hemoglobin: 12.9 g/dL (ref 11.1–15.9)
Immature Grans (Abs): 0 10*3/uL (ref 0.0–0.1)
Immature Granulocytes: 0 %
Lymphocytes Absolute: 1.8 10*3/uL (ref 0.7–3.1)
Lymphs: 35 %
MCH: 31.1 pg (ref 26.6–33.0)
MCHC: 33.6 g/dL (ref 31.5–35.7)
MCV: 93 fL (ref 79–97)
Monocytes Absolute: 0.4 10*3/uL (ref 0.1–0.9)
Monocytes: 7 %
Neutrophils Absolute: 2.5 10*3/uL (ref 1.4–7.0)
Neutrophils: 48 %
Platelets: 220 10*3/uL (ref 150–450)
RBC: 4.15 x10E6/uL (ref 3.77–5.28)
RDW: 12.4 % (ref 11.7–15.4)
WBC: 5.3 10*3/uL (ref 3.4–10.8)

## 2019-02-28 ENCOUNTER — Other Ambulatory Visit: Payer: Self-pay

## 2019-02-28 ENCOUNTER — Ambulatory Visit: Payer: 59

## 2019-03-10 ENCOUNTER — Encounter: Payer: Self-pay | Admitting: Family Medicine

## 2019-04-05 DIAGNOSIS — R040 Epistaxis: Secondary | ICD-10-CM | POA: Diagnosis not present

## 2019-05-06 DIAGNOSIS — R040 Epistaxis: Secondary | ICD-10-CM | POA: Diagnosis not present

## 2019-10-13 ENCOUNTER — Ambulatory Visit: Payer: 59 | Admitting: Family Medicine

## 2019-11-09 ENCOUNTER — Other Ambulatory Visit: Payer: Self-pay

## 2019-11-09 ENCOUNTER — Encounter: Payer: Self-pay | Admitting: Family Medicine

## 2019-11-09 ENCOUNTER — Ambulatory Visit (INDEPENDENT_AMBULATORY_CARE_PROVIDER_SITE_OTHER): Payer: 59 | Admitting: Family Medicine

## 2019-11-09 VITALS — BP 104/73 | HR 77 | Temp 98.7°F | Ht 61.0 in | Wt 123.1 lb

## 2019-11-09 DIAGNOSIS — B351 Tinea unguium: Secondary | ICD-10-CM | POA: Diagnosis not present

## 2019-11-09 DIAGNOSIS — Z0000 Encounter for general adult medical examination without abnormal findings: Secondary | ICD-10-CM

## 2019-11-09 DIAGNOSIS — Z0001 Encounter for general adult medical examination with abnormal findings: Secondary | ICD-10-CM

## 2019-11-09 DIAGNOSIS — B372 Candidiasis of skin and nail: Secondary | ICD-10-CM

## 2019-11-09 MED ORDER — NYSTATIN 100000 UNIT/GM EX CREA: 1.0000 "application " | TOPICAL_CREAM | Freq: Two times a day (BID) | CUTANEOUS | 1 refills | Status: DC

## 2019-11-09 NOTE — Patient Instructions (Signed)
Recommended using tea tree oil for 6 months on her left toenail  Give antifungal cream and recommended changing bras more often and using antifungal powder under her bra to help with the rash under her breasts

## 2019-11-09 NOTE — Progress Notes (Signed)
BP 104/73   Pulse 77   Temp 98.7 F (37.1 C) (Temporal)   Ht _0  (1.549 m)   Wt 123 lb 2 oz (55.8 kg)   BMI 23.26 kg/m    Subjective:   Patient ID: Katrina Duncan, female    DOB: [birth year], 39 y.o.   MRN: [id number]  HPI: Katrina Duncan is a 39 y.o. female presenting on 11/09/2019 for Annual Exam   HPI Adult well exam and physical Patient is coming in today for adult well exam and physical.  She says the only issue that she is been having is over the past few months she has a rash under her breasts and she has a toenail that is thickened and discolored and splitting.  She says the rash on her breast has been associated with her working out more at work.  She denies it being pruritic or painful or any drainage.  She says it has not spread but is just mostly stayed under there.  The toenail has also not changed much and she did use tea tree oil once but it did not really help.  Relevant past medical, surgical, family and social history reviewed and updated as indicated. Interim medical history since our last visit reviewed. Allergies and medications reviewed and updated.  Review of Systems  Constitutional: Negative for chills and fever.  HENT: Negative for ear pain and tinnitus.   Eyes: Negative for pain, redness and visual disturbance.  Respiratory: Negative for cough, chest tightness, shortness of breath and wheezing.   Cardiovascular: Negative for chest pain, palpitations and leg swelling.  Gastrointestinal: Negative for abdominal pain, blood in stool, constipation, diarrhea, nausea and vomiting.  Genitourinary: Negative for dysuria and hematuria.  Musculoskeletal: Negative for back pain, gait problem and myalgias.  Skin: Positive for color change and rash.  Neurological: Negative for dizziness, weakness, light-headedness and headaches.  Psychiatric/Behavioral: Negative for agitation, behavioral problems and suicidal ideas.  All other systems reviewed and are  negative.   Per HPI unless specifically indicated above   Allergies as of 11/09/2019      Reactions   Penicillins    hives   Wellbutrin [bupropion]       Medication List       Accurate as of November 09, 2019  3:06 PM. If you have any questions, ask your nurse or doctor.        multivitamin tablet Take 1 tablet by mouth daily.        Objective:   BP 104/73   Pulse 77   Temp 98.7 F (37.1 C) (Temporal)   Ht _1  (1.549 m)   Wt 123 lb 2 oz (55.8 kg)   BMI 23.26 kg/m   Wt Readings from Last 3 Encounters:  11/09/19 123 lb 2 oz (55.8 kg)  02/23/19 124 lb 12.8 oz (56.6 kg)  06/17/17 120 lb 6 oz (54.6 kg)    Physical Exam Vitals and nursing note reviewed.  Constitutional:      General: She is not in acute distress.    Appearance: She is well-developed. She is not diaphoretic.  Eyes:     Conjunctiva/sclera: Conjunctivae normal.  Cardiovascular:     Rate and Rhythm: Normal rate and regular rhythm.     Heart sounds: Normal heart sounds. No murmur.  Pulmonary:     Effort: Pulmonary effort is normal. No respiratory distress.     Breath sounds: Normal breath sounds. No wheezing.  Abdominal:     General: Abdomen  is flat. Bowel sounds are normal. There is no distension.     Tenderness: There is no abdominal tenderness. There is no right CVA tenderness, left CVA tenderness, guarding or rebound.  Musculoskeletal:        General: No tenderness. Normal range of motion.  Skin:    General: Skin is warm and dry.     Findings: Rash (Small patches, 3 under left breast and one under right breast that have a slight scaliness and slight clearing centrally) present.  Neurological:     Mental Status: She is alert and oriented to person, place, and time.     Coordination: Coordination normal.  Psychiatric:        Behavior: Behavior normal.       Assessment & Plan:   Problem List Items Addressed This Visit    None    Visit Diagnoses    Well adult exam    -  Primary    Relevant Orders   CBC with Differential/Platelet   CMP14+EGFR   Lipid panel   TSH   Yeast dermatitis       Relevant Medications   nystatin cream (MYCOSTATIN)   Onychomycosis       Relevant Medications   nystatin cream (MYCOSTATIN)      For the onychomycosis she will try tea tree oil, only on left fifth toenail  Under breasts yeast dermatitis, she will change her bras and wash them more often and change them after exercising and use an antifungal powder and antifungal cream. Follow up plan: Return in about 1 year (around 11/08/2020), or if symptoms worsen or fail to improve.  Counseling provided for all of the vaccine components No orders of the defined types were placed in this encounter.   Caryl Pina, MD [facility] 11/09/2019, 3:06 PM

## 2019-11-10 ENCOUNTER — Other Ambulatory Visit: Payer: 59

## 2019-11-10 DIAGNOSIS — Z0000 Encounter for general adult medical examination without abnormal findings: Secondary | ICD-10-CM

## 2019-11-11 LAB — CMP14+EGFR
ALT: 11 IU/L (ref 0–32)
AST: 18 IU/L (ref 0–40)
Albumin/Globulin Ratio: 1.9 (ref 1.2–2.2)
Albumin: 4.8 g/dL (ref 3.8–4.8)
Alkaline Phosphatase: 59 IU/L (ref 48–121)
BUN/Creatinine Ratio: 23 (ref 9–23)
BUN: 19 mg/dL (ref 6–24)
Bilirubin Total: 0.7 mg/dL (ref 0.0–1.2)
CO2: 21 mmol/L (ref 20–29)
Calcium: 9.2 mg/dL (ref 8.7–10.2)
Chloride: 102 mmol/L (ref 96–106)
Creatinine, Ser: 0.84 mg/dL (ref 0.57–1.00)
GFR calc Af Amer: 100 mL/min/{1.73_m2} (ref >59)
GFR calc non Af Amer: 87 mL/min/{1.73_m2} (ref >59)
Globulin, Total: 2.5 g/dL (ref 1.5–4.5)
Glucose: 81 mg/dL (ref 65–99)
Potassium: 4.1 mmol/L (ref 3.5–5.2)
Sodium: 137 mmol/L (ref 134–144)
Total Protein: 7.3 g/dL (ref 6.0–8.5)

## 2019-11-11 LAB — CBC WITH DIFFERENTIAL/PLATELET
Basophils Absolute: 0.1 10*3/uL (ref 0.0–0.2)
Basos: 1 %
EOS (ABSOLUTE): 0.7 10*3/uL — ABNORMAL HIGH (ref 0.0–0.4)
Eos: 10 %
Hematocrit: 38.3 % (ref 34.0–46.6)
Hemoglobin: 12.7 g/dL (ref 11.1–15.9)
Immature Grans (Abs): 0 10*3/uL (ref 0.0–0.1)
Immature Granulocytes: 0 %
Lymphocytes Absolute: 1.9 10*3/uL (ref 0.7–3.1)
Lymphs: 27 %
MCH: 30 pg (ref 26.6–33.0)
MCHC: 33.2 g/dL (ref 31.5–35.7)
MCV: 90 fL (ref 79–97)
Monocytes Absolute: 0.5 10*3/uL (ref 0.1–0.9)
Monocytes: 8 %
Neutrophils Absolute: 3.8 10*3/uL (ref 1.4–7.0)
Neutrophils: 54 %
Platelets: 244 10*3/uL (ref 150–450)
RBC: 4.24 x10E6/uL (ref 3.77–5.28)
RDW: 12.9 % (ref 11.7–15.4)
WBC: 6.9 10*3/uL (ref 3.4–10.8)

## 2019-11-11 LAB — LIPID PANEL
Chol/HDL Ratio: 2.7 ratio (ref 0.0–4.4)
Cholesterol, Total: 217 mg/dL — ABNORMAL HIGH (ref 100–199)
HDL: 80 mg/dL (ref >39)
LDL Chol Calc (NIH): 122 mg/dL — ABNORMAL HIGH (ref 0–99)
Triglycerides: 85 mg/dL (ref 0–149)
VLDL Cholesterol Cal: 15 mg/dL (ref 5–40)

## 2019-11-11 LAB — TSH: TSH: 1.26 u[IU]/mL (ref 0.450–4.500)

## 2019-11-15 DIAGNOSIS — H5213 Myopia, bilateral: Secondary | ICD-10-CM | POA: Diagnosis not present

## 2019-12-22 ENCOUNTER — Encounter: Payer: Self-pay | Admitting: Family Medicine

## 2019-12-22 DIAGNOSIS — Z1283 Encounter for screening for malignant neoplasm of skin: Secondary | ICD-10-CM

## 2020-01-03 DIAGNOSIS — F419 Anxiety disorder, unspecified: Secondary | ICD-10-CM | POA: Diagnosis not present

## 2020-01-04 ENCOUNTER — Encounter: Payer: Self-pay | Admitting: Family Medicine

## 2020-01-05 ENCOUNTER — Other Ambulatory Visit (HOSPITAL_COMMUNITY): Payer: Self-pay | Admitting: Family Medicine

## 2020-01-05 MED ORDER — ESOMEPRAZOLE MAGNESIUM 40 MG PO CPDR: 40.0000 mg | DELAYED_RELEASE_CAPSULE | Freq: Two times a day (BID) | ORAL | 3 refills | Status: DC

## 2020-01-10 ENCOUNTER — Encounter: Payer: Self-pay | Admitting: Family Medicine

## 2020-01-10 DIAGNOSIS — Z1389 Encounter for screening for other disorder: Secondary | ICD-10-CM | POA: Diagnosis not present

## 2020-01-10 DIAGNOSIS — Z1151 Encounter for screening for human papillomavirus (HPV): Secondary | ICD-10-CM | POA: Diagnosis not present

## 2020-01-10 DIAGNOSIS — Z124 Encounter for screening for malignant neoplasm of cervix: Secondary | ICD-10-CM | POA: Diagnosis not present

## 2020-01-10 DIAGNOSIS — Z01419 Encounter for gynecological examination (general) (routine) without abnormal findings: Secondary | ICD-10-CM | POA: Diagnosis not present

## 2020-01-10 DIAGNOSIS — Z130 Encounter for screening for diseases of the blood and blood-forming organs and certain disorders involving the immune mechanism: Secondary | ICD-10-CM | POA: Diagnosis not present

## 2020-01-10 DIAGNOSIS — F419 Anxiety disorder, unspecified: Secondary | ICD-10-CM | POA: Diagnosis not present

## 2020-01-10 DIAGNOSIS — F411 Generalized anxiety disorder: Secondary | ICD-10-CM | POA: Diagnosis not present

## 2020-01-10 DIAGNOSIS — Z6822 Body mass index (BMI) 22.0-22.9, adult: Secondary | ICD-10-CM | POA: Diagnosis not present

## 2020-01-10 DIAGNOSIS — Z1231 Encounter for screening mammogram for malignant neoplasm of breast: Secondary | ICD-10-CM | POA: Diagnosis not present

## 2020-01-10 NOTE — Telephone Encounter (Signed)
Pt states that she has a sharp pain in rib cage while lying down on her left side. She denies N&V, sweating, headache. Offered to scheduled patient in after hours or with another provider but she would like to be seen in the office and see Dr. Louanne Skye. Her work scheduled and distance from home is a problem with scheduling an appointment per the patient.  Pt is scheduled for 01/26/20 with [name] for now

## 2020-01-17 DIAGNOSIS — F419 Anxiety disorder, unspecified: Secondary | ICD-10-CM | POA: Diagnosis not present

## 2020-01-18 ENCOUNTER — Other Ambulatory Visit: Payer: Self-pay | Admitting: Obstetrics and Gynecology

## 2020-01-18 DIAGNOSIS — N6489 Other specified disorders of breast: Secondary | ICD-10-CM

## 2020-01-20 ENCOUNTER — Other Ambulatory Visit: Payer: Self-pay

## 2020-01-20 ENCOUNTER — Ambulatory Visit: Payer: 59

## 2020-01-20 ENCOUNTER — Ambulatory Visit
Admission: RE | Admit: 2020-01-20 | Discharge: 2020-01-20 | Disposition: A | Discharge status: Home / Self Care | Payer: 59 | Source: Ambulatory Visit | Attending: Obstetrics and Gynecology | Admitting: Obstetrics and Gynecology

## 2020-01-20 DIAGNOSIS — R928 Other abnormal and inconclusive findings on diagnostic imaging of breast: Secondary | ICD-10-CM | POA: Diagnosis not present

## 2020-01-20 DIAGNOSIS — N6489 Other specified disorders of breast: Secondary | ICD-10-CM

## 2020-01-22 ENCOUNTER — Encounter: Payer: Self-pay | Admitting: Family Medicine

## 2020-01-26 ENCOUNTER — Ambulatory Visit: Payer: 59 | Admitting: Family Medicine

## 2020-01-31 DIAGNOSIS — F419 Anxiety disorder, unspecified: Secondary | ICD-10-CM | POA: Diagnosis not present

## 2020-02-14 DIAGNOSIS — F419 Anxiety disorder, unspecified: Secondary | ICD-10-CM | POA: Diagnosis not present

## 2020-03-28 ENCOUNTER — Encounter: Payer: Self-pay | Admitting: Family Medicine

## 2020-03-28 MED FILL — ESOMEPRAZOLE MAG DR 40 MG C: 40 | 30 days supply | Qty: 60 | Fill #0

## 2020-04-03 ENCOUNTER — Encounter: Payer: Self-pay | Admitting: Internal Medicine

## 2020-04-03 ENCOUNTER — Ambulatory Visit (INDEPENDENT_AMBULATORY_CARE_PROVIDER_SITE_OTHER): Payer: 59 | Admitting: Internal Medicine

## 2020-04-03 ENCOUNTER — Other Ambulatory Visit: Payer: 59

## 2020-04-03 VITALS — BP 112/60 | HR 81 | Ht 61.0 in | Wt 119.0 lb

## 2020-04-03 DIAGNOSIS — K219 Gastro-esophageal reflux disease without esophagitis: Secondary | ICD-10-CM

## 2020-04-03 DIAGNOSIS — K580 Irritable bowel syndrome with diarrhea: Secondary | ICD-10-CM

## 2020-04-03 DIAGNOSIS — R197 Diarrhea, unspecified: Secondary | ICD-10-CM

## 2020-04-03 NOTE — Progress Notes (Signed)
  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  Patient has been seen previously for reflux disease.  She underwent upper endoscopy November 2017 regarding the same.  She was found to have mild esophagitis and a few gastric erosions.  Testing for Helicobacter pylori was negative.  Regular PPI therapy recommended.  Patient tells me that she has been off and on Nexium variably over the years.  When she continues medical therapy on a regular basis she has little in the way of GI symptoms.  When she discontinues the medication she is plagued with pyrosis, nausea, and occasional vomiting.  Off medication she will have several episodes per week.  She recently resumed Nexium 40 mg daily, 5 days ago.  Symptoms are slowly improving.  No dysphagia.  Next, she reports chronic problems with anxiety associated diarrhea.  Underwent colonoscopy as a child.  Was told that she had what sounds like irritable bowel syndrome.  No problems with bleeding.  Not necessarily food related.  She has completed her Covid vaccination series  REVIEW OF SYSTEMS:  All non-GI ROS negative unless otherwise stated in HPI.  No past medical history on file.  Past Surgical History:  Procedure Laterality Date  . CESAREAN SECTION  2014  . CESAREAN SECTION N/A 03/30/2015   Procedure: CESAREAN SECTION;  Surgeon: Edwinna Areola, DO;  Location: [facility];  Service: Obstetrics;  Laterality: N/A;  . TONSILLECTOMY    . TUBAL LIGATION    . UPPER GASTROINTESTINAL ENDOSCOPY  2017  . WISDOM TOOTH EXTRACTION  2010   3 removed, left one    Social History Katrina Duncan  reports that she has never smoked. She has never used smokeless tobacco. She reports current alcohol use. She reports that she does not use drugs.  family history includes Breast cancer  in her maternal grandmother and paternal grandmother; Lupus in her mother; Ovarian cancer in her mother; Sjogren's syndrome in her mother; Ulcerative colitis in her father.  Allergies  Allergen Reactions  . Penicillins     hives  . Wellbutrin [Bupropion]        PHYSICAL EXAMINATION: Vital signs: BP 112/60   Pulse 81   Ht 5\' 1"  (1.549 m)   Wt 119 lb (54 kg)   SpO2 99%   BMI 22.48 kg/m   Constitutional: generally well-appearing, no acute distress Psychiatric: alert and oriented x3, cooperative Eyes: extraocular movements intact, anicteric, conjunctiva pink Mouth: oral pharynx moist, no lesions Neck: supple no lymphadenopathy Cardiovascular: heart regular rate and rhythm, no murmur Lungs: clear to auscultation bilaterally Abdomen: soft, nontender, nondistended, no obvious ascites, no peritoneal signs, normal bowel sounds, no organomegaly Rectal: Omitted Extremities: no clubbing, cyanosis, or lower extremity edema bilaterally Skin: no lesions on visible extremities Neuro: No focal deficits.  Cranial nerves intact  ASSESSMENT:  1.  Chronic GERD.  Requiring PPI for control 2.  Problems with diarrhea as described.  Suspect IBS   PLAN:  1.  Reflux precautions 2.  Resume Nexium 40 mg daily.  Take continuously until follow-up 3.  We discussed alternatives to medical therapy such as TIF. 4.  Check serologies screening for celiac disease 5.  Office follow-up 3 months.

## 2020-04-03 NOTE — Patient Instructions (Signed)
Your provider has requested that you go to the basement level for lab work before leaving today. Press "B" on the elevator. The lab is located at the first door on the left as you exit the elevator.  Continue your Nexium every day.  Please follow up in 6 months.

## 2020-04-04 LAB — TISSUE TRANSGLUTAMINASE, IGA: (tTG) Ab, IgA: <1 U/mL

## 2020-04-04 LAB — IGA: Immunoglobulin A: 148 mg/dL (ref 47–310)

## 2020-05-01 ENCOUNTER — Encounter: Payer: Self-pay | Admitting: Family Medicine

## 2020-05-02 ENCOUNTER — Encounter: Payer: Self-pay | Admitting: Family Medicine

## 2020-05-02 ENCOUNTER — Other Ambulatory Visit (HOSPITAL_COMMUNITY): Payer: Self-pay | Admitting: Family Medicine

## 2020-05-02 ENCOUNTER — Ambulatory Visit (INDEPENDENT_AMBULATORY_CARE_PROVIDER_SITE_OTHER): Payer: 59 | Admitting: Family Medicine

## 2020-05-02 DIAGNOSIS — J029 Acute pharyngitis, unspecified: Secondary | ICD-10-CM | POA: Diagnosis not present

## 2020-05-02 MED ORDER — FLUTICASONE PROPIONATE 50 MCG/ACT NA SUSP: 1.0000 | Freq: Two times a day (BID) | NASAL | 6 refills | Status: DC | PRN

## 2020-05-02 MED ORDER — CEFDINIR 300 MG PO CAPS: 300.0000 mg | ORAL_CAPSULE | Freq: Two times a day (BID) | ORAL | 0 refills | Status: DC

## 2020-05-02 MED FILL — CEFDINIR 300 MG CAPS: 300 | 10 days supply | Qty: 20 | Fill #0

## 2020-05-02 MED FILL — FLUTICASONE PROP 50 MCG SPR: 50 | 30 days supply | Qty: 16 | Fill #0

## 2020-05-02 NOTE — Progress Notes (Signed)
Virtual Visit via telephone Note  I connected with Katrina Duncan on 05/02/20 at 1030 by telephone and verified that I am speaking with the correct person using two identifiers. Katrina Duncan is currently located at home and patient are currently with her during visit. The provider, Elige Radon Destine Ambroise, MD is located in their office at time of visit.  Call ended at 1041  I discussed the limitations, risks, security and privacy concerns of performing an evaluation and management service by telephone and the availability of in person appointments. I also discussed with the patient that there may be a patient responsible charge related to this service. The patient expressed understanding and agreed to proceed.   History and Present Illness: Patient has sore throat that started 2 weeks ago.  She started with congestion and cough. Everything is mostly gone except sore throat.  She still has sore throat and is using OTC dayquil and nyquil.  She denies any wheezing or fevers or chills. Her family has had similar illness. She feels worse at night and when she wakes up then improves with fluid intake. She has a lot of redness but had tonsils removed as a child.   1. Pharyngitis, unspecified etiology     Outpatient Encounter Medications as of 05/02/2020  Medication Sig  . Biotin (BIOTIN MAXIMUM) 10000 MCG TBDP Take by mouth.  . cefdinir (OMNICEF) 300 MG capsule Take 1 capsule (300 mg total) by mouth 2 (two) times daily. 1 po BID  . Cholecalciferol (VITAMIN D-3 PO) Take 5,000 Units by mouth.  Philippa Chester EXTRACT PO Take 900 mg by mouth.  . esomeprazole (NEXIUM) 40 MG capsule Take 1 capsule (40 mg total) by mouth 2 (two) times daily before a meal. (Patient taking differently: Take 40 mg by mouth daily. )  . fluticasone (FLONASE) 50 MCG/ACT nasal spray Place 1 spray into both nostrils 2 (two) times daily as needed for allergies or rhinitis.  Marland Kitchen lactobacillus acidophilus (BACID) TABS  tablet Take 1 tablet by mouth daily.  . Multiple Vitamin (MULTIVITAMIN) tablet Take 1 tablet by mouth daily.  . Multiple Vitamins-Minerals (WOMENS DAILY FORMULA) TABS Take by mouth.  . nystatin cream (MYCOSTATIN) Apply 1 application topically 2 (two) times daily. (Patient not taking: Reported on 04/03/2020)  . Omega 3 1000 MG CAPS Take 1,000 mg by mouth daily.  . Turmeric (QC TUMERIC COMPLEX) 500 MG CAPS Take 500 mg by mouth daily.  . vitamin C (ASCORBIC ACID) 250 MG tablet Take 250 mg by mouth 2 (two) times daily.  Marland Kitchen zinc gluconate 50 MG tablet Take 50 mg by mouth daily.   Facility-Administered Encounter Medications as of 05/02/2020  Medication  . 0.9 %  sodium chloride infusion    Review of Systems  Constitutional: Negative for chills and fever.  HENT: Positive for congestion, postnasal drip and sore throat. Negative for ear discharge, ear pain, rhinorrhea, sinus pressure and sneezing.   Eyes: Negative for pain, redness and visual disturbance.  Respiratory: Positive for cough. Negative for chest tightness and shortness of breath.   Cardiovascular: Negative for chest pain and leg swelling.  Skin: Negative for rash.  All other systems reviewed and are negative.   Observations/Objective: Patient sounds comfortable and in no acute distress  Assessment and Plan: Problem List Items Addressed This Visit    None    Visit Diagnoses    Pharyngitis, unspecified etiology    -  Primary   Relevant Medications   fluticasone (FLONASE) 50 MCG/ACT nasal  spray   cefdinir (OMNICEF) 300 MG capsule      Patient has Tylenol pharyngitis with redness in the back of her throat per patient, will treat like possible pharyngitis with bacteria because of duration. Follow up plan: Return if symptoms worsen or fail to improve.     I discussed the assessment and treatment plan with the patient. The patient was provided an opportunity to ask questions and all were answered. The patient agreed with the plan  and demonstrated an understanding of the instructions.   The patient was advised to call back or seek an in-person evaluation if the symptoms worsen or if the condition fails to improve as anticipated.  The above assessment and management plan was discussed with the patient. The patient verbalized understanding of and has agreed to the management plan. Patient is aware to call the clinic if symptoms persist or worsen. Patient is aware when to return to the clinic for a follow-up visit. Patient educated on when it is appropriate to go to the emergency department.    I provided 11 minutes of non-face-to-face time during this encounter.    Nils Pyle, MD

## 2020-05-10 ENCOUNTER — Encounter: Payer: Self-pay | Admitting: Family Medicine

## 2020-05-11 MED FILL — ESOMEPRAZOLE MAG DR 40 MG C: 40 | 30 days supply | Qty: 60 | Fill #1

## 2020-05-11 NOTE — Telephone Encounter (Signed)
Patient had televisit with you on 12/1 visit dx was pharyngitis still complains of sore throat was given Flonase and omnicef. Want her to be seen in person or call something else in? Please advise?

## 2020-06-26 ENCOUNTER — Other Ambulatory Visit: Payer: Self-pay

## 2020-06-26 ENCOUNTER — Ambulatory Visit (INDEPENDENT_AMBULATORY_CARE_PROVIDER_SITE_OTHER): Payer: 59 | Admitting: Dermatology

## 2020-06-26 ENCOUNTER — Encounter: Payer: Self-pay | Admitting: Dermatology

## 2020-06-26 DIAGNOSIS — B360 Pityriasis versicolor: Secondary | ICD-10-CM | POA: Diagnosis not present

## 2020-06-26 DIAGNOSIS — Z1283 Encounter for screening for malignant neoplasm of skin: Secondary | ICD-10-CM | POA: Diagnosis not present

## 2020-06-26 NOTE — Patient Instructions (Signed)
Get Over the Counter Clotrimazole and 1% Ketoconazole Shampoo use for 1 month.

## 2020-06-27 ENCOUNTER — Encounter: Payer: Self-pay | Admitting: Dermatology

## 2020-06-27 NOTE — Progress Notes (Signed)
   New Patient   Subjective  Katrina Duncan is a 40 y.o. female who presents for the following: Annual Exam (No new conerns).  General skin check Location:  Duration:  Quality: No no changes Associated Signs/Symptoms: Modifying Factors:  Severity:  Timing: Context: Also has rash below breasts. (No itching)   The following portions of the chart were reviewed this encounter and updated as appropriate:  Tobacco  Allergies  Meds  Problems  Med Hx  Surg Hx  Fam Hx      Objective  Well appearing patient in no apparent distress; mood and affect are within normal limits. Objective  Chest - Medial [facility]): Typical branny 1 to 2 cm patches of pink scale lower breast, front rib cage.  Objective  Head to Toe: Clear head to toe exam today. No signs of atypical moles, melanoma, or non mole skin cancer.    A full examination was performed including scalp, head, eyes, ears, nose, lips, neck, chest, axillae, abdomen, back, buttocks, bilateral upper extremities, bilateral lower extremities, hands, feet, fingers, toes, fingernails, and toenails. All findings within normal limits unless otherwise noted below.   Assessment & Plan  Tinea versicolor Chest - Medial [facility])  Harmless can be ignored or you can treat. Two ways to treat, you can take oral antifungal tablet or topical. Initially will use two over the counter topicals Clotrimazole cream or lotion applied to and beyond the spots after bathing for 1 month, and 1% Ketoconazole Shampoo preshampoo once weekly for x 1 month. I told that the pigmentation may not fade for several months. Should this treatment fail, she could call me to discuss a brief course of oral itraconazole.  Skin exam for malignant neoplasm Head to Toe  Yearly skin check; encouraged to self examine skin twice annually. Continue ultraviolet protection.

## 2020-07-03 DIAGNOSIS — Z20828 Contact with and (suspected) exposure to other viral communicable diseases: Secondary | ICD-10-CM | POA: Diagnosis not present

## 2020-07-09 MED FILL — ESOMEPRAZOLE MAG DR 40 MG C: 40 | 30 days supply | Qty: 60 | Fill #2

## 2020-08-14 DIAGNOSIS — R634 Abnormal weight loss: Secondary | ICD-10-CM | POA: Diagnosis not present

## 2020-08-14 DIAGNOSIS — K909 Intestinal malabsorption, unspecified: Secondary | ICD-10-CM | POA: Diagnosis not present

## 2020-08-14 DIAGNOSIS — R5383 Other fatigue: Secondary | ICD-10-CM | POA: Diagnosis not present

## 2020-08-21 ENCOUNTER — Other Ambulatory Visit (HOSPITAL_BASED_OUTPATIENT_CLINIC_OR_DEPARTMENT_OTHER): Payer: Self-pay

## 2020-09-03 ENCOUNTER — Other Ambulatory Visit: Payer: Self-pay

## 2020-09-03 ENCOUNTER — Other Ambulatory Visit (HOSPITAL_COMMUNITY): Payer: Self-pay

## 2020-09-03 MED ORDER — ESOMEPRAZOLE MAGNESIUM 40 MG PO CPDR
40.0000 mg | DELAYED_RELEASE_CAPSULE | Freq: Two times a day (BID) | ORAL | 11 refills | Status: DC
Start: 2020-09-03 — End: 2020-09-03
  Filled 2020-09-03: qty 60, 30d supply, fill #0

## 2020-09-03 MED ORDER — ESOMEPRAZOLE MAGNESIUM 40 MG PO CPDR
40.0000 mg | DELAYED_RELEASE_CAPSULE | Freq: Every day | ORAL | 11 refills | Status: DC
Start: 2020-09-03 — End: 2021-07-19
  Filled 2020-09-03: qty 30, 30d supply, fill #0
  Filled 2020-10-14: qty 30, 30d supply, fill #1
  Filled 2020-11-12: qty 30, 30d supply, fill #2
  Filled 2020-12-19: qty 30, 30d supply, fill #3
  Filled 2021-01-14: qty 30, 30d supply, fill #4
  Filled 2021-02-15: qty 30, 30d supply, fill #5
  Filled 2021-03-15: qty 30, 30d supply, fill #6
  Filled 2021-04-13: qty 30, 30d supply, fill #7
  Filled 2021-05-20: qty 30, 30d supply, fill #8
  Filled 2021-06-19: qty 30, 30d supply, fill #9
  Filled 2021-07-18: qty 30, 30d supply, fill #10

## 2020-09-06 ENCOUNTER — Other Ambulatory Visit (HOSPITAL_COMMUNITY): Payer: Self-pay

## 2020-10-08 ENCOUNTER — Other Ambulatory Visit (HOSPITAL_COMMUNITY): Payer: Self-pay

## 2020-10-15 ENCOUNTER — Other Ambulatory Visit (HOSPITAL_COMMUNITY): Payer: Self-pay

## 2020-11-12 ENCOUNTER — Other Ambulatory Visit (HOSPITAL_COMMUNITY): Payer: Self-pay

## 2020-11-14 ENCOUNTER — Other Ambulatory Visit (HOSPITAL_COMMUNITY): Payer: Self-pay

## 2020-11-15 ENCOUNTER — Ambulatory Visit (INDEPENDENT_AMBULATORY_CARE_PROVIDER_SITE_OTHER): Payer: 59 | Admitting: Family Medicine

## 2020-11-15 ENCOUNTER — Other Ambulatory Visit (HOSPITAL_COMMUNITY): Payer: Self-pay

## 2020-11-15 ENCOUNTER — Encounter: Payer: Self-pay | Admitting: Family Medicine

## 2020-11-15 ENCOUNTER — Other Ambulatory Visit: Payer: Self-pay

## 2020-11-15 VITALS — BP 114/74 | HR 75 | Ht 61.0 in | Wt 121.0 lb

## 2020-11-15 DIAGNOSIS — R002 Palpitations: Secondary | ICD-10-CM

## 2020-11-15 DIAGNOSIS — Z0000 Encounter for general adult medical examination without abnormal findings: Secondary | ICD-10-CM | POA: Diagnosis not present

## 2020-11-15 DIAGNOSIS — K582 Mixed irritable bowel syndrome: Secondary | ICD-10-CM

## 2020-11-15 MED ORDER — ONDANSETRON 4 MG PO TBDP
4.0000 mg | ORAL_TABLET | Freq: Three times a day (TID) | ORAL | 3 refills | Status: DC | PRN
  Filled 2020-11-15: qty 30, 10d supply, fill #0

## 2020-11-15 MED ORDER — DICYCLOMINE HCL 10 MG PO CAPS
10.0000 mg | ORAL_CAPSULE | Freq: Three times a day (TID) | ORAL | 3 refills | Status: DC
  Filled 2020-11-15: qty 90, 23d supply, fill #0

## 2020-11-15 NOTE — Progress Notes (Signed)
BP 114/74   Pulse 75   Ht 5\' 1"  (1.549 m)   Wt 121 lb (54.9 kg)   SpO2 99%   BMI 22.86 kg/m    Subjective:   Patient ID: Katrina Duncan, female    DOB: 05-Apr-1981, 40 y.o.   MRN: [id number]  HPI: Katrina Duncan is a 40 y.o. female presenting on 11/15/2020 for Medical Management of Chronic Issues (CPE)   HPI Adult well exam Patient is coming in today for adult well exam and physical.  She says she still has her bowel issues where she will be fine for couple months and then she gets irritated for what ever reason and has diarrhea and vomiting and then will improve again and not have it for a while that this is been something that she has been fighting off and on for her whole life and that sometimes she will get palpitations and flutters and feel anxious.  She says sometimes it happens when she does feel anxious and she does notice she is worried and then sometimes it does not.  Patient has gone to multiple different specialists in the past including GI and to an alternative medicine doctor and has not really found any answers and is trying some supplements that the alternative medicine doctor gave her.   Relevant past medical, surgical, family and social history reviewed and updated as indicated. Interim medical history since our last visit reviewed. Allergies and medications reviewed and updated.  Review of Systems  Constitutional:  Negative for chills and fever.  HENT:  Negative for congestion, ear discharge and ear pain.   Eyes:  Negative for redness and visual disturbance.  Respiratory:  Negative for chest tightness and shortness of breath.   Cardiovascular:  Negative for chest pain and leg swelling.  Gastrointestinal:  Positive for diarrhea, nausea and vomiting. Negative for abdominal pain, blood in stool and constipation.  Genitourinary:  Negative for difficulty urinating and dysuria.  Musculoskeletal:  Negative for back pain and gait problem.  Skin:  Negative  for rash.  Neurological:  Negative for light-headedness and headaches.  Psychiatric/Behavioral:  Negative for agitation and behavioral problems.   All other systems reviewed and are negative.  Per HPI unless specifically indicated above   Allergies as of 11/15/2020       Reactions   Penicillins    hives   Wellbutrin [bupropion]         Medication List        Accurate as of November 15, 2020  3:18 PM. If you have any questions, ask your nurse or doctor.          STOP taking these medications    cefdinir 300 MG capsule Commonly known as: OMNICEF Stopped by: November 17, 2020, MD   ECHINACEA EXTRACT PO Stopped by: Nils Pyle Cheng Dec, MD   fluticasone 50 MCG/ACT nasal spray Commonly known as: FLONASE Stopped by: Elige Radon, MD   multivitamin tablet Stopped by: Nils Pyle, MD   nystatin cream Commonly known as: MYCOSTATIN Stopped by: Nils Pyle, MD   Omega 3 1000 MG Caps Stopped by: Nils Pyle, MD   Turmeric 500 MG Caps Stopped by: Nils Pyle, MD   VITAMIN D-3 PO Stopped by: Nils Pyle Brantley Wiley, MD       TAKE these medications    Biotin Maximum 10000 MCG Tbdp Generic drug: Biotin Take by mouth.   DHEA PO Take 5 mg by mouth daily.   dicyclomine 10  MG capsule Commonly known as: Bentyl Take 1 capsule (10 mg total) by mouth 4 (four) times daily -  before meals and at bedtime. Started by: Elige Radon Ryleigh Esqueda, MD   esomeprazole 40 MG capsule Commonly known as: NEXIUM TAKE 1 CAPSULE BY MOUTH TWO TIMES DAILY BEFORE A MEAL. What changed: Another medication with the same name was removed. Continue taking this medication, and follow the directions you see here. Changed by: Elige Radon Basia Mcginty, MD   IRON 27 PO Take 25 mg by mouth daily.   lactobacillus acidophilus Tabs tablet Take 1 tablet by mouth daily.   NON FORMULARY Take by mouth daily. Pregneloone   ondansetron 4 MG disintegrating tablet Commonly known as:  Zofran ODT Take 1 tablet (4 mg total) by mouth every 8 (eight) hours as needed for nausea or vomiting. Started by: Nils Pyle, MD   vitamin C 250 MG tablet Commonly known as: ASCORBIC ACID Take 250 mg by mouth 2 (two) times daily.   Womens Daily Formula Tabs Take by mouth.   zinc gluconate 50 MG tablet Take 50 mg by mouth daily.         Objective:   BP 114/74   Pulse 75   Ht 5\' 1"  (1.549 m)   Wt 121 lb (54.9 kg)   SpO2 99%   BMI 22.86 kg/m   Wt Readings from Last 3 Encounters:  11/15/20 121 lb (54.9 kg)  04/03/20 119 lb (54 kg)  11/09/19 123 lb 2 oz (55.8 kg)    Physical Exam Vitals and nursing note reviewed.  Constitutional:      General: She is not in acute distress.    Appearance: She is well-developed. She is not diaphoretic.  Eyes:     Extraocular Movements: Extraocular movements intact.     Conjunctiva/sclera: Conjunctivae normal.     Pupils: Pupils are equal, round, and reactive to light.  Cardiovascular:     Rate and Rhythm: Normal rate and regular rhythm.     Heart sounds: Normal heart sounds. No murmur heard. Pulmonary:     Effort: Pulmonary effort is normal. No respiratory distress.     Breath sounds: Normal breath sounds. No wheezing.  Abdominal:     General: Abdomen is flat. Bowel sounds are normal. There is no distension.     Tenderness: There is no abdominal tenderness. There is no guarding or rebound.  Musculoskeletal:        General: No tenderness. Normal range of motion.  Skin:    General: Skin is warm and dry.     Findings: No rash.  Neurological:     Mental Status: She is alert and oriented to person, place, and time.     Coordination: Coordination normal.  Psychiatric:        Behavior: Behavior normal.    EKG: Normal sinus rhythm, heart rate 66  Assessment & Plan:   Problem List Items Addressed This Visit   None Visit Diagnoses     Well adult exam    -  Primary   Relevant Orders   Lipid panel   Palpitation        Relevant Orders   EKG 12-Lead   Irritable bowel syndrome with both constipation and diarrhea       Relevant Medications   dicyclomine (BENTYL) 10 MG capsule   ondansetron (ZOFRAN ODT) 4 MG disintegrating tablet       She isWill try Bentyl as needed and Zofran as needed for anxiety and IBS. She is  also going to try some over-the-counter medicine supplements for the anxiety.  Including the possibility of black cohosh which is when we suggested. Follow up plan: Return in about 3 months (around 02/15/2021), or if symptoms worsen or fail to improve, for Follow-up IBS and anxiety.  Counseling provided for all of the vaccine components Orders Placed This Encounter  Procedures   Lipid panel   EKG 12-Lead    Arville Care, MD [facility] 11/15/2020, 3:18 PM

## 2020-11-19 ENCOUNTER — Other Ambulatory Visit (HOSPITAL_COMMUNITY): Payer: Self-pay

## 2020-11-19 ENCOUNTER — Encounter: Payer: Self-pay | Admitting: Family Medicine

## 2020-12-05 ENCOUNTER — Encounter: Payer: Self-pay | Admitting: Family Medicine

## 2020-12-19 ENCOUNTER — Other Ambulatory Visit (HOSPITAL_COMMUNITY): Payer: Self-pay

## 2020-12-27 ENCOUNTER — Encounter: Payer: Self-pay | Admitting: Family Medicine

## 2020-12-27 NOTE — Telephone Encounter (Signed)
Dr. Louanne Skye,  Pt works next door to our office. She wanted to see if you would be ok with just doing labs and not having an appointment.   Pt aware that [name] will return on Monday

## 2020-12-31 ENCOUNTER — Ambulatory Visit: Payer: 59 | Admitting: Family

## 2020-12-31 ENCOUNTER — Encounter: Payer: Self-pay | Admitting: Family

## 2020-12-31 DIAGNOSIS — R110 Nausea: Secondary | ICD-10-CM | POA: Diagnosis not present

## 2020-12-31 DIAGNOSIS — R55 Syncope and collapse: Secondary | ICD-10-CM

## 2020-12-31 DIAGNOSIS — R002 Palpitations: Secondary | ICD-10-CM | POA: Diagnosis not present

## 2020-12-31 DIAGNOSIS — R5383 Other fatigue: Secondary | ICD-10-CM

## 2020-12-31 NOTE — Addendum Note (Signed)
Addended by: Margorie John on: 12/31/2020 11:58 AM   Modules accepted: Orders

## 2020-12-31 NOTE — Progress Notes (Signed)
   Virtual Visit  Note Due to COVID-19 pandemic this visit was conducted virtually. This visit type was conducted due to national recommendations for restrictions regarding the COVID-19 Pandemic (e.g. social distancing, sheltering in place) in an effort to limit this patient's exposure and mitigate transmission in our community. All issues noted in this document were discussed and addressed.  A physical exam was not performed with this format.  I connected with Katrina Duncan on 12/31/20 at 9:24 AM  by telephone and verified that I am speaking with the correct person using two identifiers. Katrina Duncan is currently located at work and no one is currently with her during visit. The provider, Jannifer Rodney, FNP is located in their office at time of visit.  I discussed the limitations, risks, security and privacy concerns of performing an evaluation and management service by telephone and the availability of in person appointments. I also discussed with the patient that there may be a patient responsible charge related to this service. The patient expressed understanding and agreed to proceed.   History and Present Illness:  HPI Pt calls the office with complaints of "feeling like she is going to pass out".  She states this happens around every 2 months and lasts for 10 mins. However, after the spell she states she has decreased appetite and fatigue that lasts a week. She states this started  in January.   She has seen her PCP in the past and was given Zofran.   She saw [facility] and had lab work drawn. She states they were not helpful.   She reports she had to have growth hormones as a child and requesting a referral to Endocrinologists.   Review of Systems  Constitutional:  Positive for malaise/fatigue.  All other systems reviewed and are negative.   Observations/Objective: No SOB or distress noted, anxious.   Assessment and Plan: 1. Near syncope -  Ambulatory referral to Endocrinology - CBC with Differential/Platelet; Future - Cortisol; Future  2. Nausea - Ambulatory referral to Endocrinology - CBC with Differential/Platelet; Future - Cortisol; Future  3. Fatigue, unspecified type - Ambulatory referral to Endocrinology - CBC with Differential/Platelet; Future - Cortisol; Future  4. Palpitations - Ambulatory referral to Endocrinology - CBC with Differential/Platelet; Future - Cortisol; Future  Given symptoms I recommend a follow up with Cardiologists and Holter monitor. She wants a referral to Endocrinologists.  I reviewed lab work from 08/14/20.  Keep follow up with PCP.     I discussed the assessment and treatment plan with the patient. The patient was provided an opportunity to ask questions and all were answered. The patient agreed with the plan and demonstrated an understanding of the instructions.   The patient was advised to call back or seek an in-person evaluation if the symptoms worsen or if the condition fails to improve as anticipated.  The above assessment and management plan was discussed with the patient. The patient verbalized understanding of and has agreed to the management plan. Patient is aware to call the clinic if symptoms persist or worsen. Patient is aware when to return to the clinic for a follow-up visit. Patient educated on when it is appropriate to go to the emergency department.   Time call ended:  9:48 AM   I provided 24  minutes of  non face-to-face time during this encounter.    Jannifer Rodney, FNP

## 2021-01-01 LAB — CBC WITH DIFFERENTIAL/PLATELET
Basophils Absolute: 0.1 x10E3/uL (ref 0.0–0.2)
Basos: 1 %
EOS (ABSOLUTE): 0.2 x10E3/uL (ref 0.0–0.4)
Eos: 2 %
Hematocrit: 35.9 % (ref 34.0–46.6)
Hemoglobin: 11.5 g/dL (ref 11.1–15.9)
Immature Grans (Abs): 0 x10E3/uL (ref 0.0–0.1)
Immature Granulocytes: 1 %
Lymphocytes Absolute: 2.2 x10E3/uL (ref 0.7–3.1)
Lymphs: 25 %
MCH: 27.7 pg (ref 26.6–33.0)
MCHC: 32 g/dL (ref 31.5–35.7)
MCV: 87 fL (ref 79–97)
Monocytes Absolute: 0.4 x10E3/uL (ref 0.1–0.9)
Monocytes: 5 %
Neutrophils Absolute: 5.7 x10E3/uL (ref 1.4–7.0)
Neutrophils: 66 %
Platelets: 280 x10E3/uL (ref 150–450)
RBC: 4.15 x10E6/uL (ref 3.77–5.28)
RDW: 14 % (ref 11.7–15.4)
WBC: 8.6 x10E3/uL (ref 3.4–10.8)

## 2021-01-01 LAB — CORTISOL: Cortisol: 13.8 ug/dL

## 2021-01-03 ENCOUNTER — Other Ambulatory Visit (HOSPITAL_COMMUNITY): Payer: Self-pay

## 2021-01-03 MED ORDER — DULOXETINE HCL 20 MG PO CPEP
20.0000 mg | ORAL_CAPSULE | Freq: Every day | ORAL | 2 refills | Status: DC
  Filled 2021-01-03: qty 30, 30d supply, fill #0
  Filled 2021-01-30: qty 30, 30d supply, fill #1
  Filled 2021-02-21: qty 30, 30d supply, fill #2

## 2021-01-14 ENCOUNTER — Other Ambulatory Visit (HOSPITAL_COMMUNITY): Payer: Self-pay

## 2021-01-14 ENCOUNTER — Other Ambulatory Visit: Payer: Self-pay | Admitting: Family Medicine

## 2021-01-14 DIAGNOSIS — R002 Palpitations: Secondary | ICD-10-CM

## 2021-01-14 NOTE — Progress Notes (Signed)
Placed referral to Dr. Antoine Poche at patient's request for palpitations

## 2021-01-22 DIAGNOSIS — Z1231 Encounter for screening mammogram for malignant neoplasm of breast: Secondary | ICD-10-CM | POA: Diagnosis not present

## 2021-01-22 DIAGNOSIS — Z01419 Encounter for gynecological examination (general) (routine) without abnormal findings: Secondary | ICD-10-CM | POA: Diagnosis not present

## 2021-01-22 DIAGNOSIS — N939 Abnormal uterine and vaginal bleeding, unspecified: Secondary | ICD-10-CM | POA: Diagnosis not present

## 2021-01-22 DIAGNOSIS — Z6822 Body mass index (BMI) 22.0-22.9, adult: Secondary | ICD-10-CM | POA: Diagnosis not present

## 2021-01-22 DIAGNOSIS — Z01411 Encounter for gynecological examination (general) (routine) with abnormal findings: Secondary | ICD-10-CM | POA: Diagnosis not present

## 2021-01-22 DIAGNOSIS — Z1389 Encounter for screening for other disorder: Secondary | ICD-10-CM | POA: Diagnosis not present

## 2021-01-22 DIAGNOSIS — N926 Irregular menstruation, unspecified: Secondary | ICD-10-CM | POA: Diagnosis not present

## 2021-01-31 ENCOUNTER — Other Ambulatory Visit (HOSPITAL_COMMUNITY): Payer: Self-pay

## 2021-02-14 ENCOUNTER — Other Ambulatory Visit: Payer: Self-pay

## 2021-02-15 ENCOUNTER — Other Ambulatory Visit (HOSPITAL_COMMUNITY): Payer: Self-pay

## 2021-02-21 ENCOUNTER — Other Ambulatory Visit (HOSPITAL_COMMUNITY): Payer: Self-pay

## 2021-02-22 ENCOUNTER — Other Ambulatory Visit (HOSPITAL_COMMUNITY): Payer: Self-pay

## 2021-02-25 ENCOUNTER — Encounter: Payer: Self-pay | Admitting: Family Medicine

## 2021-02-25 ENCOUNTER — Other Ambulatory Visit (HOSPITAL_COMMUNITY): Payer: Self-pay

## 2021-02-26 ENCOUNTER — Other Ambulatory Visit (HOSPITAL_COMMUNITY): Payer: Self-pay

## 2021-02-26 ENCOUNTER — Other Ambulatory Visit: Payer: Self-pay

## 2021-02-26 MED ORDER — DULOXETINE HCL 20 MG PO CPEP
20.0000 mg | ORAL_CAPSULE | Freq: Every day | ORAL | 0 refills | Status: DC
  Filled 2021-02-27: qty 90, 90d supply, fill #0

## 2021-02-27 ENCOUNTER — Other Ambulatory Visit (HOSPITAL_COMMUNITY): Payer: Self-pay

## 2021-03-14 ENCOUNTER — Encounter: Payer: Self-pay | Admitting: Dermatology

## 2021-03-15 ENCOUNTER — Other Ambulatory Visit (HOSPITAL_COMMUNITY): Payer: Self-pay

## 2021-04-14 ENCOUNTER — Other Ambulatory Visit (HOSPITAL_COMMUNITY): Payer: Self-pay

## 2021-04-15 ENCOUNTER — Other Ambulatory Visit (HOSPITAL_COMMUNITY): Payer: Self-pay

## 2021-05-20 ENCOUNTER — Other Ambulatory Visit (HOSPITAL_COMMUNITY): Payer: Self-pay

## 2021-05-20 ENCOUNTER — Other Ambulatory Visit: Payer: Self-pay | Admitting: Family Medicine

## 2021-05-20 MED ORDER — DULOXETINE HCL 20 MG PO CPEP
20.0000 mg | ORAL_CAPSULE | Freq: Every day | ORAL | 0 refills | Status: DC
  Filled 2021-05-20: qty 30, 30d supply, fill #0

## 2021-05-28 ENCOUNTER — Encounter: Payer: Self-pay | Admitting: *Deleted

## 2021-06-19 ENCOUNTER — Other Ambulatory Visit: Payer: Self-pay | Admitting: Family Medicine

## 2021-06-19 ENCOUNTER — Other Ambulatory Visit (HOSPITAL_COMMUNITY): Payer: Self-pay

## 2021-06-19 NOTE — Telephone Encounter (Signed)
[name]. NTBS 30 days given 05/21/21

## 2021-06-19 NOTE — Telephone Encounter (Signed)
Pt called and scheduled appt to see PCP for med refill on 07/19/21. Wants to know if Dr [name] can send in enough of her medicine to last her until her appt. Pt aware that she has already been given an additional refill back in December.  Please advise and call patient.

## 2021-06-20 ENCOUNTER — Other Ambulatory Visit (HOSPITAL_COMMUNITY): Payer: Self-pay

## 2021-06-20 MED ORDER — DULOXETINE HCL 20 MG PO CPEP
20.0000 mg | ORAL_CAPSULE | Freq: Every day | ORAL | 0 refills | Status: DC
  Filled 2021-06-20: qty 30, 30d supply, fill #0

## 2021-06-21 ENCOUNTER — Other Ambulatory Visit (HOSPITAL_COMMUNITY): Payer: Self-pay

## 2021-06-25 ENCOUNTER — Other Ambulatory Visit (HOSPITAL_COMMUNITY): Payer: Self-pay

## 2021-07-18 ENCOUNTER — Other Ambulatory Visit (HOSPITAL_COMMUNITY): Payer: Self-pay

## 2021-07-19 ENCOUNTER — Encounter: Payer: Self-pay | Admitting: Family Medicine

## 2021-07-19 ENCOUNTER — Ambulatory Visit (INDEPENDENT_AMBULATORY_CARE_PROVIDER_SITE_OTHER): Payer: BC Managed Care – PPO | Admitting: Family Medicine

## 2021-07-19 ENCOUNTER — Other Ambulatory Visit (HOSPITAL_COMMUNITY): Payer: Self-pay

## 2021-07-19 VITALS — BP 110/76 | HR 82 | Ht 61.0 in | Wt 130.0 lb

## 2021-07-19 DIAGNOSIS — K582 Mixed irritable bowel syndrome: Secondary | ICD-10-CM | POA: Diagnosis not present

## 2021-07-19 DIAGNOSIS — K219 Gastro-esophageal reflux disease without esophagitis: Secondary | ICD-10-CM

## 2021-07-19 DIAGNOSIS — Z13220 Encounter for screening for lipoid disorders: Secondary | ICD-10-CM | POA: Diagnosis not present

## 2021-07-19 DIAGNOSIS — F419 Anxiety disorder, unspecified: Secondary | ICD-10-CM

## 2021-07-19 DIAGNOSIS — F32A Depression, unspecified: Secondary | ICD-10-CM

## 2021-07-19 MED ORDER — ESOMEPRAZOLE MAGNESIUM 40 MG PO CPDR
40.0000 mg | DELAYED_RELEASE_CAPSULE | Freq: Two times a day (BID) | ORAL | 3 refills | Status: DC
  Filled 2021-07-19 – 2021-08-19 (×2): qty 180, 90d supply, fill #0

## 2021-07-19 MED ORDER — DULOXETINE HCL 20 MG PO CPEP
20.0000 mg | ORAL_CAPSULE | Freq: Every day | ORAL | 3 refills | Status: DC
  Filled 2021-07-19: qty 90, 90d supply, fill #0
  Filled 2021-10-25: qty 30, 30d supply, fill #1

## 2021-07-19 NOTE — Progress Notes (Signed)
BP 110/76    Pulse 82    Ht 5' 1" (1.549 m)    Wt 130 lb (59 kg)    SpO2 99%    BMI 24.56 kg/m    Subjective:   Patient ID: Katrina Duncan, female    DOB: 18-Jan-1981, 41 y.o.   MRN: [id number]  HPI: Katrina Duncan is a 41 y.o. female presenting on 07/19/2021 for Medical Management of Chronic Issues   HPI GERD Patient is currently on Nexium.  She denies any major symptoms or abdominal pain or belching or burping. She denies any blood in her stool or lightheadedness or dizziness.   Anxiety and depression Patient is currently on Cymbalta.  Feels like her irritable bowels are doing a lot better.  She denies any major issues.  She denies any suicidal ideations.  She feels like the medicine is doing very well for her and she has been very satisfied over the past year. Depression screen [facility] 2/9 11/15/2020 11/09/2019 02/23/2019 06/17/2017 12/25/2016  Decreased Interest 0 0 0 0 0  Down, Depressed, Hopeless 0 0 0 0 0  PHQ - 2 Score 0 0 0 0 0     Relevant past medical, surgical, family and social history reviewed and updated as indicated. Interim medical history since our last visit reviewed. Allergies and medications reviewed and updated.  Review of Systems  Constitutional:  Negative for chills and fever.  Eyes:  Negative for visual disturbance.  Respiratory:  Negative for chest tightness and shortness of breath.   Cardiovascular:  Negative for chest pain and leg swelling.  Musculoskeletal:  Negative for back pain and gait problem.  Skin:  Negative for rash.  Neurological:  Negative for dizziness, light-headedness and headaches.  Psychiatric/Behavioral:  Negative for agitation and behavioral problems.   All other systems reviewed and are negative.  Per HPI unless specifically indicated above   Allergies as of 07/19/2021       Reactions   Penicillins    hives   Wellbutrin [bupropion]         Medication List        Accurate as of July 19, 2021 11:40 AM. If  you have any questions, ask your nurse or doctor.          STOP taking these medications    DHEA PO Stopped by: Fransisca Kaufmann Omesha Bowerman, MD   dicyclomine 10 MG capsule Commonly known as: Bentyl Stopped by: Fransisca Kaufmann Chantae Soo, MD   ondansetron 4 MG disintegrating tablet Commonly known as: Zofran ODT Stopped by: Fransisca Kaufmann Damacio Weisgerber, MD       TAKE these medications    Biotin Maximum 10000 MCG Tbdp Generic drug: Biotin Take by mouth.   DULoxetine 20 MG capsule Commonly known as: Cymbalta Take 1 capsule (20 mg total) by mouth daily.   esomeprazole 40 MG capsule Commonly known as: NEXIUM Take 1 capsule (40 mg total) by mouth 2 (two) times daily before a meal. What changed:  how much to take how to take this when to take this Another medication with the same name was removed. Continue taking this medication, and follow the directions you see here. Changed by: Fransisca Kaufmann Nyree Applegate, MD   IRON 27 PO Take 25 mg by mouth daily.   lactobacillus acidophilus Tabs tablet Take 1 tablet by mouth daily.   NON FORMULARY Take by mouth daily. Pregneloone   vitamin C 250 MG tablet Commonly known as: ASCORBIC ACID Take 250 mg by mouth 2 (two)  times daily.   Womens Daily Formula Tabs Take by mouth.   zinc gluconate 50 MG tablet Take 50 mg by mouth daily.         Objective:   BP 110/76    Pulse 82    Ht 5' 1" (1.549 m)    Wt 130 lb (59 kg)    SpO2 99%    BMI 24.56 kg/m   Wt Readings from Last 3 Encounters:  07/19/21 130 lb (59 kg)  11/15/20 121 lb (54.9 kg)  04/03/20 119 lb (54 kg)    Physical Exam Vitals and nursing note reviewed.  Constitutional:      General: She is not in acute distress.    Appearance: She is well-developed. She is not diaphoretic.  Eyes:     Conjunctiva/sclera: Conjunctivae normal.  Cardiovascular:     Rate and Rhythm: Normal rate and regular rhythm.     Heart sounds: Normal heart sounds. No murmur heard. Pulmonary:     Effort: Pulmonary  effort is normal. No respiratory distress.     Breath sounds: Normal breath sounds. No wheezing.  Musculoskeletal:        General: No tenderness. Normal range of motion.  Skin:    General: Skin is warm and dry.     Findings: No rash.  Neurological:     Mental Status: She is alert and oriented to person, place, and time.     Coordination: Coordination normal.  Psychiatric:        Behavior: Behavior normal.      Assessment & Plan:   Problem List Items Addressed This Visit   None Visit Diagnoses     Gastroesophageal reflux disease without esophagitis    -  Primary   Relevant Medications   esomeprazole (NEXIUM) 40 MG capsule   Other Relevant Orders   CBC with Differential/Platelet   Irritable bowel syndrome with both constipation and diarrhea       Relevant Medications   DULoxetine (CYMBALTA) 20 MG capsule   esomeprazole (NEXIUM) 40 MG capsule   Other Relevant Orders   CMP14+EGFR   Anxiety and depression       Relevant Medications   DULoxetine (CYMBALTA) 20 MG capsule   Other Relevant Orders   CBC with Differential/Platelet   Lipid screening       Relevant Orders   Lipid panel       Continue current medicine Follow up plan: Return in about 1 year (around 07/19/2022), or if symptoms worsen or fail to improve, for anxiety and depression and gerd.  Counseling provided for all of the vaccine components Orders Placed This Encounter  Procedures   CBC with Differential/Platelet   CMP14+EGFR   Lipid panel    Caryl Pina, MD [facility] 07/19/2021, 11:40 AM

## 2021-07-20 LAB — CBC WITH DIFFERENTIAL/PLATELET
Basophils Absolute: 0.1 10*3/uL (ref 0.0–0.2)
Basos: 1 %
EOS (ABSOLUTE): 0.4 10*3/uL (ref 0.0–0.4)
Eos: 7 %
Hematocrit: 31.9 % — ABNORMAL LOW (ref 34.0–46.6)
Hemoglobin: 10.5 g/dL — ABNORMAL LOW (ref 11.1–15.9)
Immature Grans (Abs): 0 10*3/uL (ref 0.0–0.1)
Immature Granulocytes: 1 %
Lymphocytes Absolute: 2.6 10*3/uL (ref 0.7–3.1)
Lymphs: 40 %
MCH: 26.5 pg — ABNORMAL LOW (ref 26.6–33.0)
MCHC: 32.9 g/dL (ref 31.5–35.7)
MCV: 81 fL (ref 79–97)
Monocytes Absolute: 0.6 10*3/uL (ref 0.1–0.9)
Monocytes: 10 %
Neutrophils Absolute: 2.8 10*3/uL (ref 1.4–7.0)
Neutrophils: 41 %
Platelets: 322 10*3/uL (ref 150–450)
RBC: 3.96 x10E6/uL (ref 3.77–5.28)
RDW: 13.2 % (ref 11.7–15.4)
WBC: 6.5 10*3/uL (ref 3.4–10.8)

## 2021-07-20 LAB — CMP14+EGFR
ALT: 23 IU/L (ref 0–32)
AST: 21 IU/L (ref 0–40)
Albumin/Globulin Ratio: 2 (ref 1.2–2.2)
Albumin: 4.9 g/dL — ABNORMAL HIGH (ref 3.8–4.8)
Alkaline Phosphatase: 57 IU/L (ref 44–121)
BUN/Creatinine Ratio: 19 (ref 9–23)
BUN: 16 mg/dL (ref 6–24)
Bilirubin Total: 0.5 mg/dL (ref 0.0–1.2)
CO2: 24 mmol/L (ref 20–29)
Calcium: 9.3 mg/dL (ref 8.7–10.2)
Chloride: 103 mmol/L (ref 96–106)
Creatinine, Ser: 0.84 mg/dL (ref 0.57–1.00)
Globulin, Total: 2.5 g/dL (ref 1.5–4.5)
Glucose: 81 mg/dL (ref 70–99)
Potassium: 4.3 mmol/L (ref 3.5–5.2)
Sodium: 140 mmol/L (ref 134–144)
Total Protein: 7.4 g/dL (ref 6.0–8.5)
eGFR: 88 mL/min/{1.73_m2} (ref >59)

## 2021-07-20 LAB — LIPID PANEL
Chol/HDL Ratio: 2.7 ratio (ref 0.0–4.4)
Cholesterol, Total: 237 mg/dL — ABNORMAL HIGH (ref 100–199)
HDL: 87 mg/dL (ref >39)
LDL Chol Calc (NIH): 135 mg/dL — ABNORMAL HIGH (ref 0–99)
Triglycerides: 86 mg/dL (ref 0–149)
VLDL Cholesterol Cal: 15 mg/dL (ref 5–40)

## 2021-07-30 ENCOUNTER — Other Ambulatory Visit (HOSPITAL_COMMUNITY): Payer: Self-pay

## 2021-08-19 ENCOUNTER — Other Ambulatory Visit (HOSPITAL_COMMUNITY): Payer: Self-pay

## 2021-10-25 ENCOUNTER — Other Ambulatory Visit (HOSPITAL_COMMUNITY): Payer: Self-pay

## 2021-10-29 ENCOUNTER — Other Ambulatory Visit (HOSPITAL_COMMUNITY): Payer: Self-pay

## 2021-11-21 ENCOUNTER — Telehealth: Payer: Self-pay | Admitting: Family Medicine

## 2021-11-21 DIAGNOSIS — K582 Mixed irritable bowel syndrome: Secondary | ICD-10-CM

## 2021-11-21 DIAGNOSIS — K219 Gastro-esophageal reflux disease without esophagitis: Secondary | ICD-10-CM

## 2021-11-21 DIAGNOSIS — F32A Depression, unspecified: Secondary | ICD-10-CM

## 2021-11-21 MED ORDER — ESOMEPRAZOLE MAGNESIUM 40 MG PO CPDR: 40.0000 mg | DELAYED_RELEASE_CAPSULE | Freq: Two times a day (BID) | ORAL | 1 refills | Status: DC

## 2021-11-21 MED ORDER — DULOXETINE HCL 20 MG PO CPEP: 20.0000 mg | ORAL_CAPSULE | Freq: Every day | ORAL | 1 refills | Status: DC

## 2021-11-21 NOTE — Telephone Encounter (Signed)
Aware prescriptions sent to [facility], these were written in Feb with 3 RFs, remaining refills sent

## 2021-11-21 NOTE — Telephone Encounter (Signed)
  Prescription Request  11/21/2021  Is this a "Controlled Substance" medicine? No   Have you seen your PCP in the last 2 weeks? no  If YES, route message to pool  -  If NO, patient needs to be scheduled for appointment.  What is the name of the medication or equipment? DULoxetine (CYMBALTA) 20 MG capsule esomeprazole (NEXIUM) 40 MG capsule  Have you contacted your pharmacy to request a refill? Wants to switch pharmacy    Which pharmacy would you like this sent to? [facility]    Patient notified that their request is being sent to the clinical staff for review and that they should receive a response within 2 business days.

## 2022-01-07 IMAGING — MG MM DIGITAL DIAGNOSTIC UNILAT*R* W/ TOMO W/ CAD
4 series · 4 of 12 positions shown · non-contrast
Comparison: Previous exam(s).

CLINICAL DATA: Patient was called back from screening mammogram for
a possible asymmetry in the right breast.

EXAM:
DIGITAL DIAGNOSTIC UNILATERAL RIGHT MAMMOGRAM WITH TOMO AND CAD

[R ML synth-2D]
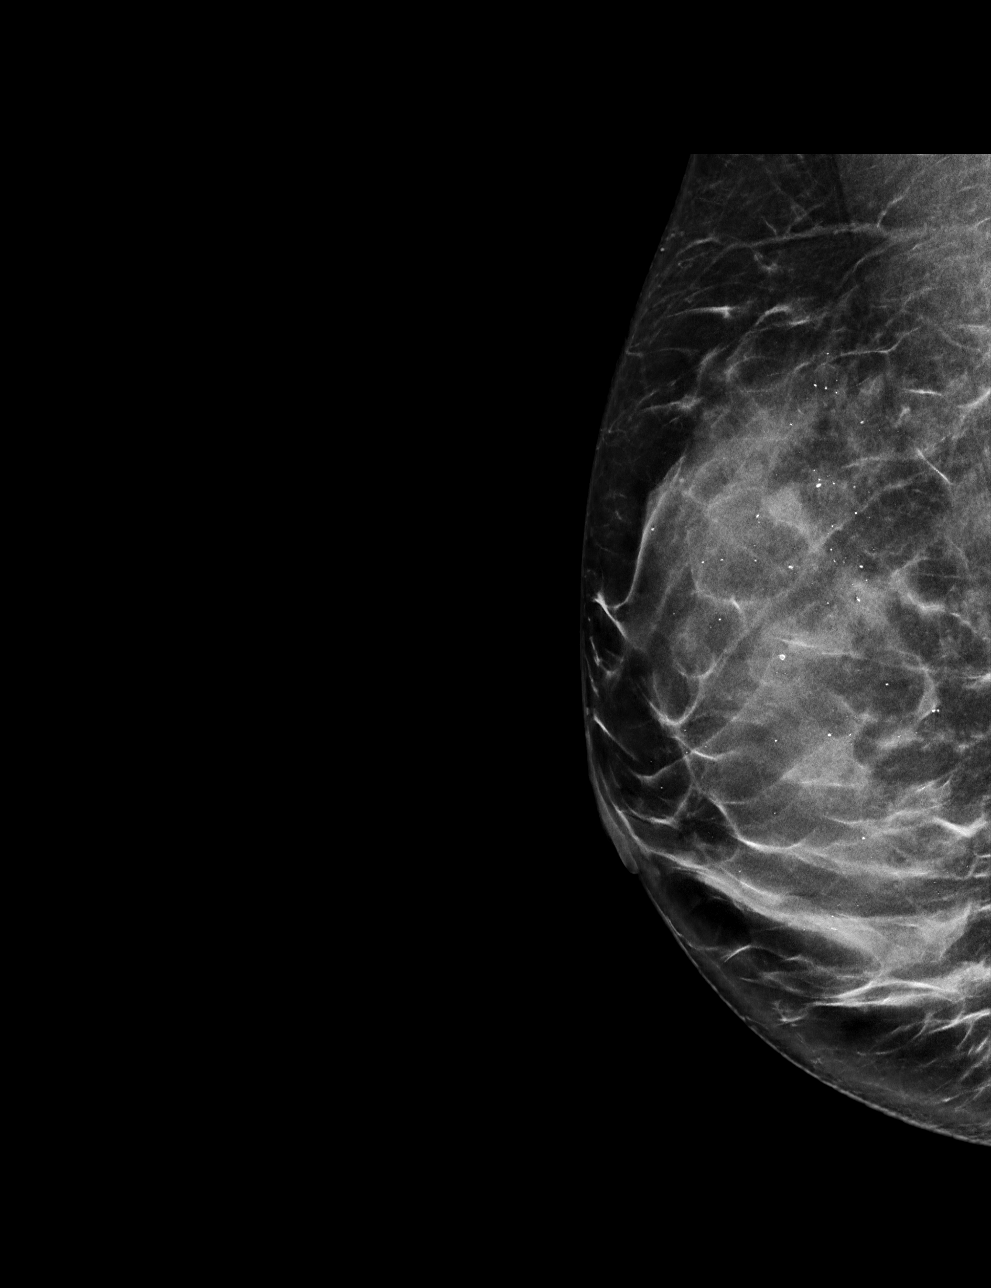

[R CC synth-2D]
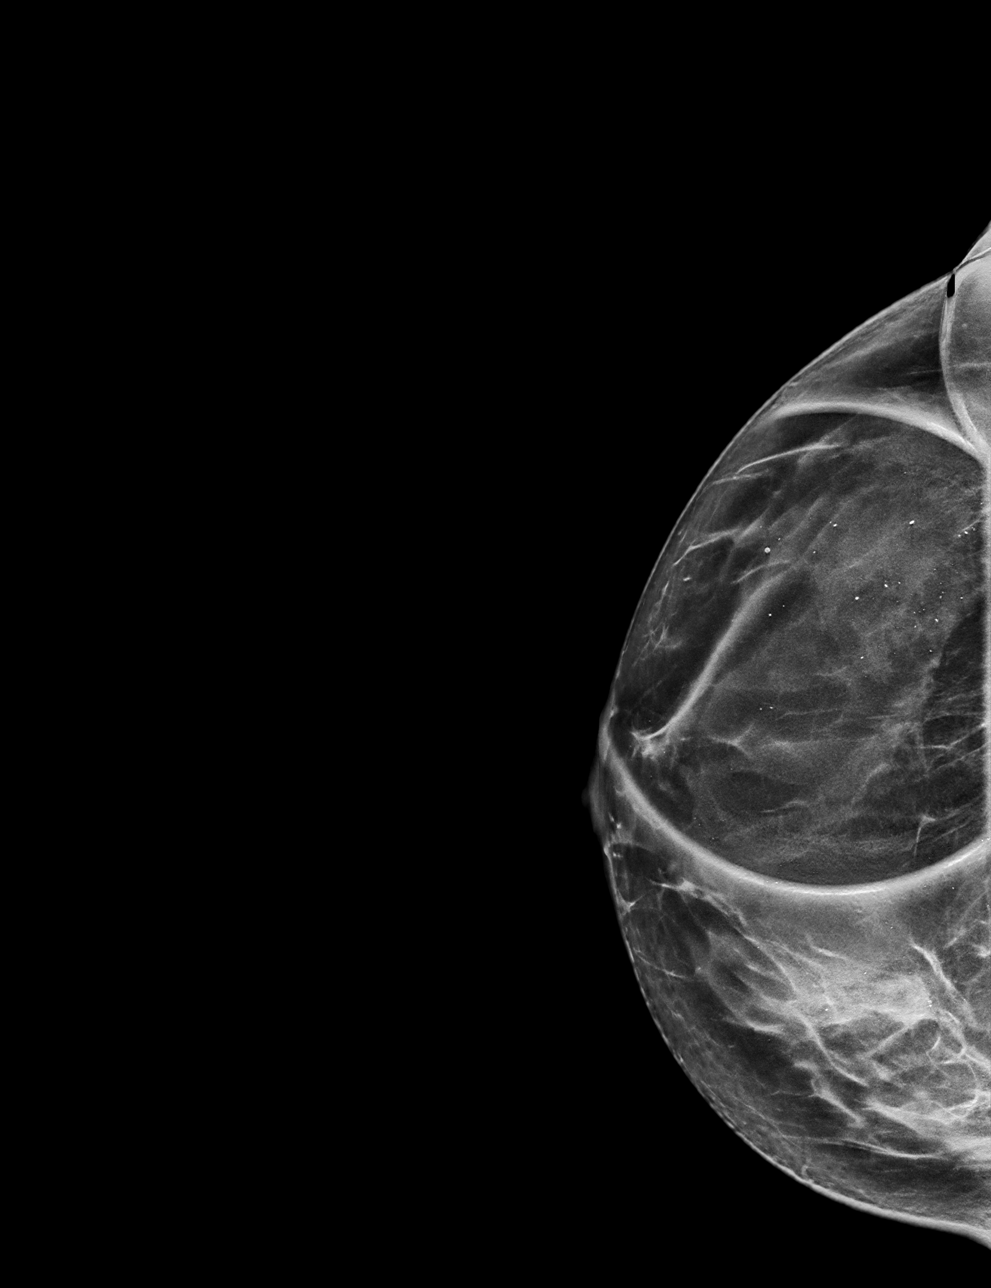

[R CC tomo · tomo slice 30/59.0]
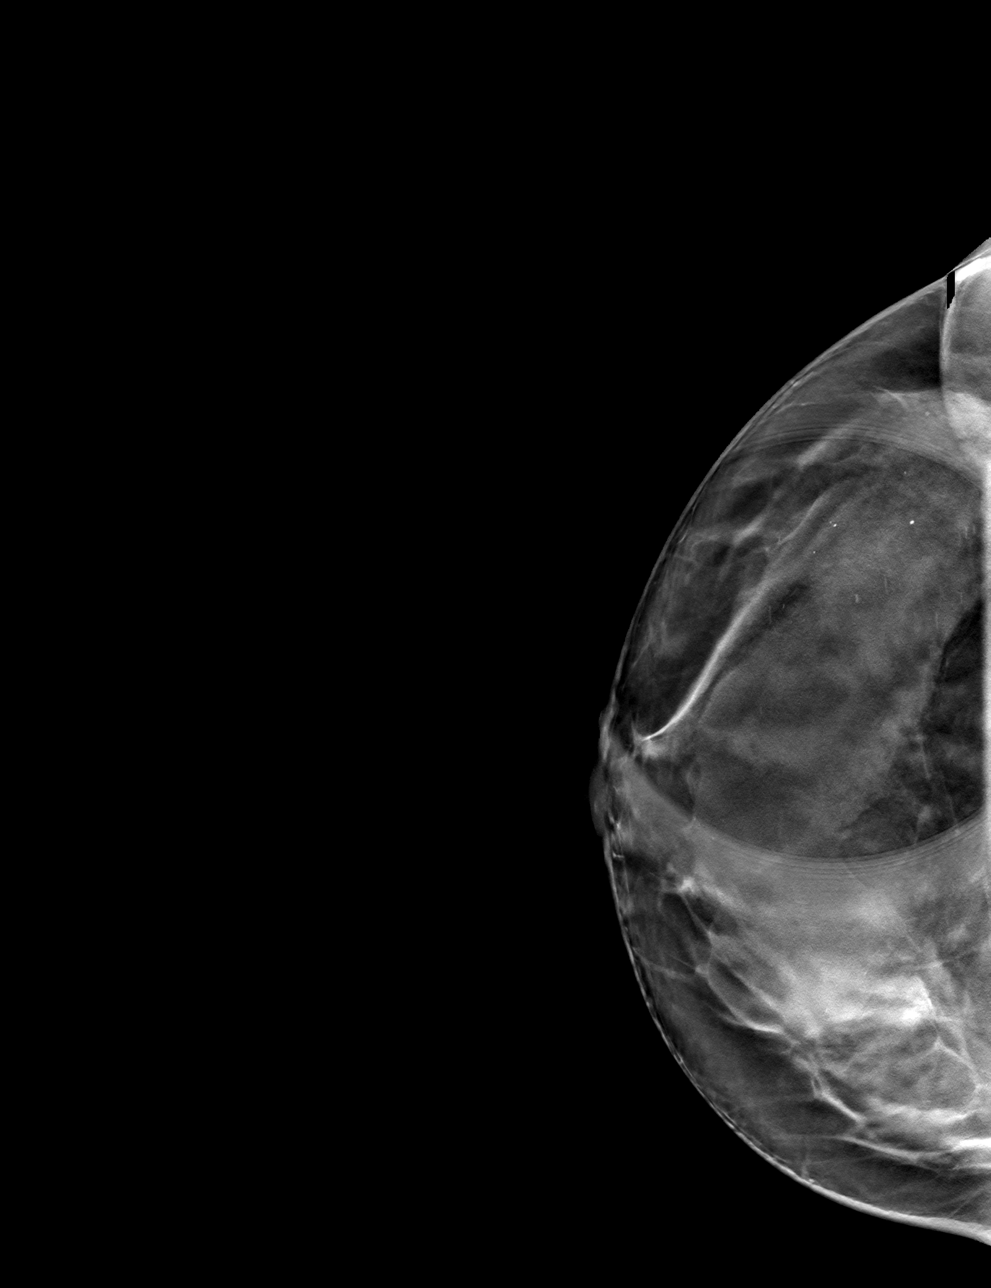

[R ML tomo · tomo slice 30/59.0]
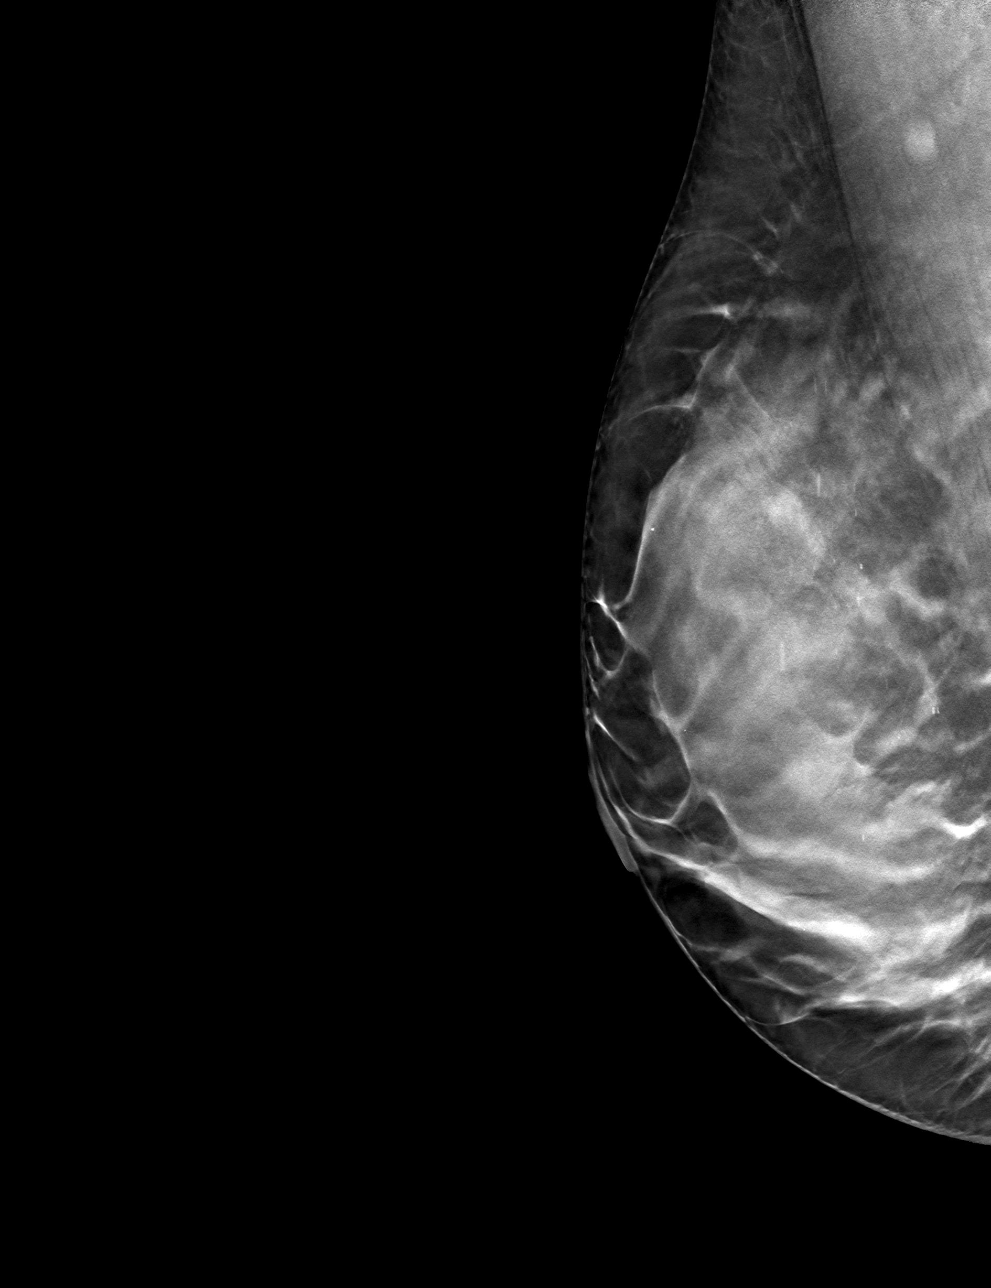

[4 of 12 positions shown; findings below may reference images not displayed]

ACR Breast Density Category c: The breast tissue is heterogeneously
dense, which may obscure small masses.
FINDINGS: Additional imaging of the right breast was performed. No persistent
mass, distortion or malignant type microcalcifications identified.

Mammographic images were processed with CAD.
IMPRESSION: No evidence of malignancy in the right breast.

RECOMMENDATION:
Bilateral screening mammogram in 1 year is recommended.

I have discussed the findings and recommendations with the patient.
If applicable, a reminder letter will be sent to the patient
regarding the next appointment.

BI-RADS CATEGORY  1: Negative.

## 2022-01-24 ENCOUNTER — Encounter: Payer: Self-pay | Admitting: Family Medicine

## 2022-01-24 ENCOUNTER — Ambulatory Visit (INDEPENDENT_AMBULATORY_CARE_PROVIDER_SITE_OTHER): Payer: BC Managed Care – PPO | Admitting: Family Medicine

## 2022-01-24 VITALS — BP 114/89 | HR 92 | Temp 98.6°F | Ht 61.0 in | Wt 133.0 lb

## 2022-01-24 DIAGNOSIS — Z1159 Encounter for screening for other viral diseases: Secondary | ICD-10-CM | POA: Diagnosis not present

## 2022-01-24 DIAGNOSIS — K219 Gastro-esophageal reflux disease without esophagitis: Secondary | ICD-10-CM

## 2022-01-24 DIAGNOSIS — R5383 Other fatigue: Secondary | ICD-10-CM

## 2022-01-24 DIAGNOSIS — N926 Irregular menstruation, unspecified: Secondary | ICD-10-CM

## 2022-01-24 DIAGNOSIS — F32A Depression, unspecified: Secondary | ICD-10-CM

## 2022-01-24 DIAGNOSIS — Z0001 Encounter for general adult medical examination with abnormal findings: Secondary | ICD-10-CM | POA: Diagnosis not present

## 2022-01-24 DIAGNOSIS — F419 Anxiety disorder, unspecified: Secondary | ICD-10-CM

## 2022-01-24 DIAGNOSIS — Z0000 Encounter for general adult medical examination without abnormal findings: Secondary | ICD-10-CM

## 2022-01-24 DIAGNOSIS — K582 Mixed irritable bowel syndrome: Secondary | ICD-10-CM | POA: Diagnosis not present

## 2022-01-24 MED ORDER — DULOXETINE HCL 20 MG PO CPEP: 20.0000 mg | ORAL_CAPSULE | Freq: Every day | ORAL | 1 refills | Status: DC

## 2022-01-24 MED ORDER — ESOMEPRAZOLE MAGNESIUM 40 MG PO CPDR: 40.0000 mg | DELAYED_RELEASE_CAPSULE | Freq: Two times a day (BID) | ORAL | 3 refills | Status: DC

## 2022-01-24 NOTE — Progress Notes (Signed)
BP 114/89   Pulse 92   Temp 98.6 F (37 C)   Ht '5\' 1"'  (1.549 m)   Wt 133 lb (60.3 kg)   LMP 01/15/2022 (Approximate)   SpO2 95%   BMI 25.13 kg/m    Subjective:   Patient ID: Katrina Duncan, female    DOB: 01-23-1981, 41 y.o.   MRN: [id number]  HPI: Katrina Duncan is a 41 y.o. female presenting on 01/24/2022 for Annual Exam   HPI Adult well exam Patient denies any chest pain, shortness of breath, headaches or vision issues, abdominal complaints, diarrhea, nausea, vomiting, or joint issues.  Patient has her gynecological exam next week but coming here for her normal physical and blood work.  Her biggest complaint is that she just feels like she has a little bit less energy and her cycles have been irregular, sometimes every couple weeks and sporadic.  Patient takes Cymbalta to help with anxiety depression and irritable bowels.  She feels like her anxiety and depression and irritable bowels are doing good.  She had 1 episode where she had hypoglycemic overnight but it passed quickly and has not had since.  GERD Patient is currently on Nexium.  She denies any major symptoms or abdominal pain or belching or burping. She denies any blood in her stool or lightheadedness or dizziness.   Relevant past medical, surgical, family and social history reviewed and updated as indicated. Interim medical history since our last visit reviewed. Allergies and medications reviewed and updated.  Review of Systems  Constitutional:  Positive for fatigue. Negative for chills and fever.  HENT:  Negative for congestion, ear discharge and ear pain.   Eyes:  Negative for redness and visual disturbance.  Respiratory:  Negative for chest tightness and shortness of breath.   Cardiovascular:  Negative for chest pain and leg swelling.  Genitourinary:  Positive for menstrual problem. Negative for difficulty urinating and dysuria.  Musculoskeletal:  Negative for back pain and gait problem.   Skin:  Negative for rash.  Neurological:  Negative for light-headedness and headaches.  Psychiatric/Behavioral:  Negative for agitation and behavioral problems.   All other systems reviewed and are negative.   Per HPI unless specifically indicated above   Allergies as of 01/24/2022       Reactions   Penicillins    hives   Wellbutrin [bupropion]         Medication List        Accurate as of January 24, 2022 10:16 AM. If you have any questions, ask your nurse or doctor.          Biotin Maximum 10000 MCG Tbdp Generic drug: Biotin Take by mouth.   DULoxetine 20 MG capsule Commonly known as: Cymbalta Take 1 capsule (20 mg total) by mouth daily.   esomeprazole 40 MG capsule Commonly known as: NEXIUM Take 1 capsule (40 mg total) by mouth 2 (two) times daily before a meal.   IRON 27 PO Take 25 mg by mouth daily.   lactobacillus acidophilus Tabs tablet Take 1 tablet by mouth daily.   NON FORMULARY Take by mouth daily. Pregneloone   vitamin C 250 MG tablet Commonly known as: ASCORBIC ACID Take 250 mg by mouth 2 (two) times daily.   Womens Daily Formula Tabs Take by mouth.   zinc gluconate 50 MG tablet Take 50 mg by mouth daily.         Objective:   BP 114/89   Pulse 92   Temp 98.6  F (37 C)   Ht '5\' 1"'  (1.549 m)   Wt 133 lb (60.3 kg)   LMP 01/15/2022 (Approximate)   SpO2 95%   BMI 25.13 kg/m   Wt Readings from Last 3 Encounters:  01/24/22 133 lb (60.3 kg)  07/19/21 130 lb (59 kg)  11/15/20 121 lb (54.9 kg)    Physical Exam Vitals and nursing note reviewed.  Constitutional:      General: She is not in acute distress.    Appearance: Normal appearance. She is well-developed. She is not diaphoretic.  HENT:     Right Ear: Tympanic membrane normal.     Left Ear: Tympanic membrane normal.     Mouth/Throat:     Mouth: Mucous membranes are moist.     Pharynx: No oropharyngeal exudate or posterior oropharyngeal erythema.  Eyes:      Conjunctiva/sclera: Conjunctivae normal.  Cardiovascular:     Rate and Rhythm: Normal rate and regular rhythm.     Heart sounds: Normal heart sounds. No murmur heard. Pulmonary:     Effort: Pulmonary effort is normal. No respiratory distress.     Breath sounds: Normal breath sounds. No wheezing.  Abdominal:     General: Abdomen is flat. Bowel sounds are normal. There is no distension.     Tenderness: There is no abdominal tenderness. There is no guarding or rebound.  Musculoskeletal:        General: No swelling or tenderness. Normal range of motion.  Skin:    General: Skin is warm and dry.     Findings: No rash.  Neurological:     Mental Status: She is alert and oriented to person, place, and time.     Coordination: Coordination normal.  Psychiatric:        Behavior: Behavior normal.       Assessment & Plan:   Problem List Items Addressed This Visit       Digestive   Gastroesophageal reflux disease without esophagitis   Relevant Medications   esomeprazole (NEXIUM) 40 MG capsule   Irritable bowel syndrome with both constipation and diarrhea   Relevant Medications   DULoxetine (CYMBALTA) 20 MG capsule   esomeprazole (NEXIUM) 40 MG capsule     Other   Anxiety and depression   Relevant Medications   DULoxetine (CYMBALTA) 20 MG capsule   Other Relevant Orders   CBC with Differential/Platelet   Thyroid Panel With TSH   Other Visit Diagnoses     Well adult exam    -  Primary   Relevant Orders   CBC with Differential/Platelet   CMP14+EGFR   Lipid panel   Thyroid Panel With TSH   FSH/LH   Estradiol   Need for hepatitis C screening test       Relevant Orders   Hepatitis C Antibody   Fatigue, unspecified type       Relevant Orders   Thyroid Panel With TSH   FSH/LH   Estradiol   Menstrual abnormality       Relevant Orders   FSH/LH   Estradiol       We will do some hormonal testing because of menstrual irregularities and fatigue.  We will do normal labs as  well. Follow up plan: Return in about 6 months (around 07/27/2022), or if symptoms worsen or fail to improve, for Recheck GERD and IBS and anxiety.  Counseling provided for all of the vaccine components Orders Placed This Encounter  Procedures   Hepatitis C Antibody   CBC with  Differential/Platelet   CMP14+EGFR   Lipid panel   Thyroid Panel With TSH   FSH/LH   Estradiol    Caryl Pina, MD [facility] 01/24/2022, 10:16 AM

## 2022-01-25 LAB — CMP14+EGFR
ALT: 14 IU/L (ref 0–32)
AST: 16 IU/L (ref 0–40)
Albumin/Globulin Ratio: 1.8 (ref 1.2–2.2)
Albumin: 4.8 g/dL (ref 3.9–4.9)
Alkaline Phosphatase: 60 IU/L (ref 44–121)
BUN/Creatinine Ratio: 17 (ref 9–23)
BUN: 15 mg/dL (ref 6–24)
Bilirubin Total: 0.4 mg/dL (ref 0.0–1.2)
CO2: 22 mmol/L (ref 20–29)
Calcium: 9.5 mg/dL (ref 8.7–10.2)
Chloride: 102 mmol/L (ref 96–106)
Creatinine, Ser: 0.88 mg/dL (ref 0.57–1.00)
Globulin, Total: 2.6 g/dL (ref 1.5–4.5)
Glucose: 82 mg/dL (ref 70–99)
Potassium: 4.5 mmol/L (ref 3.5–5.2)
Sodium: 137 mmol/L (ref 134–144)
Total Protein: 7.4 g/dL (ref 6.0–8.5)
eGFR: 84 mL/min/{1.73_m2} (ref >59)

## 2022-01-25 LAB — LIPID PANEL
Chol/HDL Ratio: 2.9 ratio (ref 0.0–4.4)
Cholesterol, Total: 215 mg/dL — ABNORMAL HIGH (ref 100–199)
HDL: 73 mg/dL (ref >39)
LDL Chol Calc (NIH): 123 mg/dL — ABNORMAL HIGH (ref 0–99)
Triglycerides: 110 mg/dL (ref 0–149)
VLDL Cholesterol Cal: 19 mg/dL (ref 5–40)

## 2022-01-25 LAB — CBC WITH DIFFERENTIAL/PLATELET
Basophils Absolute: 0.1 10*3/uL (ref 0.0–0.2)
Basos: 1 %
EOS (ABSOLUTE): 0.7 10*3/uL — ABNORMAL HIGH (ref 0.0–0.4)
Eos: 10 %
Hematocrit: 31.3 % — ABNORMAL LOW (ref 34.0–46.6)
Hemoglobin: 10.1 g/dL — ABNORMAL LOW (ref 11.1–15.9)
Immature Grans (Abs): 0 10*3/uL (ref 0.0–0.1)
Immature Granulocytes: 0 %
Lymphocytes Absolute: 1.9 10*3/uL (ref 0.7–3.1)
Lymphs: 29 %
MCH: 24.8 pg — ABNORMAL LOW (ref 26.6–33.0)
MCHC: 32.3 g/dL (ref 31.5–35.7)
MCV: 77 fL — ABNORMAL LOW (ref 79–97)
Monocytes Absolute: 0.7 10*3/uL (ref 0.1–0.9)
Monocytes: 11 %
Neutrophils Absolute: 3.3 10*3/uL (ref 1.4–7.0)
Neutrophils: 49 %
Platelets: 315 10*3/uL (ref 150–450)
RBC: 4.07 x10E6/uL (ref 3.77–5.28)
RDW: 14.4 % (ref 11.7–15.4)
WBC: 6.7 10*3/uL (ref 3.4–10.8)

## 2022-01-25 LAB — HEPATITIS C ANTIBODY: Hep C Virus Ab: NONREACTIVE

## 2022-01-25 LAB — FSH/LH
FSH: 2.1 m[IU]/mL
LH: 2.6 m[IU]/mL

## 2022-01-25 LAB — THYROID PANEL WITH TSH
Free Thyroxine Index: 1.8 (ref 1.2–4.9)
T3 Uptake Ratio: 26 % (ref 24–39)
T4, Total: 6.9 ug/dL (ref 4.5–12.0)
TSH: 0.773 u[IU]/mL (ref 0.450–4.500)

## 2022-01-25 LAB — ESTRADIOL: Estradiol: 52.9 pg/mL

## 2022-02-11 ENCOUNTER — Telehealth: Payer: Self-pay | Admitting: Family Medicine

## 2022-02-11 DIAGNOSIS — K219 Gastro-esophageal reflux disease without esophagitis: Secondary | ICD-10-CM

## 2022-02-11 MED ORDER — ESOMEPRAZOLE MAGNESIUM 40 MG PO CPDR: 40.0000 mg | DELAYED_RELEASE_CAPSULE | Freq: Two times a day (BID) | ORAL | 3 refills | Status: DC

## 2022-02-11 NOTE — Telephone Encounter (Signed)
Meds sent

## 2022-03-04 ENCOUNTER — Telehealth: Payer: Self-pay | Admitting: Genetic Counselor

## 2022-03-04 NOTE — Telephone Encounter (Signed)
Scheduled appt per 10/3 referral. Pt is aware of appt date and time. Pt is aware to arrive 15 mins prior to appt time and to bring and updated insurance card. Pt is aware of appt location.   

## 2022-05-12 ENCOUNTER — Telehealth: Payer: Self-pay | Admitting: Genetic Counselor

## 2022-05-12 NOTE — Telephone Encounter (Signed)
Called patient to schedule gen 60. Patient informed not interested at this time.

## 2022-05-13 ENCOUNTER — Encounter: Payer: Self-pay | Admitting: Genetic Counselor

## 2022-05-13 ENCOUNTER — Other Ambulatory Visit: Payer: Self-pay

## 2022-07-02 ENCOUNTER — Other Ambulatory Visit: Payer: Self-pay | Admitting: Family Medicine

## 2022-07-02 DIAGNOSIS — F419 Anxiety disorder, unspecified: Secondary | ICD-10-CM

## 2022-07-02 DIAGNOSIS — K582 Mixed irritable bowel syndrome: Secondary | ICD-10-CM

## 2022-08-26 ENCOUNTER — Encounter: Payer: Self-pay | Admitting: Family Medicine

## 2022-09-08 ENCOUNTER — Other Ambulatory Visit: Payer: Self-pay | Admitting: Family Medicine

## 2022-09-08 DIAGNOSIS — F419 Anxiety disorder, unspecified: Secondary | ICD-10-CM

## 2022-09-08 DIAGNOSIS — K582 Mixed irritable bowel syndrome: Secondary | ICD-10-CM

## 2022-11-12 ENCOUNTER — Other Ambulatory Visit: Payer: Self-pay | Admitting: Family Medicine

## 2022-11-12 DIAGNOSIS — F419 Anxiety disorder, unspecified: Secondary | ICD-10-CM

## 2022-11-12 DIAGNOSIS — K582 Mixed irritable bowel syndrome: Secondary | ICD-10-CM

## 2022-12-19 ENCOUNTER — Encounter: Payer: Self-pay | Admitting: Family Medicine

## 2023-01-05 ENCOUNTER — Encounter: Payer: Self-pay | Admitting: *Deleted

## 2023-01-07 ENCOUNTER — Other Ambulatory Visit: Payer: Self-pay | Admitting: Family Medicine

## 2023-01-07 DIAGNOSIS — K582 Mixed irritable bowel syndrome: Secondary | ICD-10-CM

## 2023-01-07 DIAGNOSIS — F32A Anxiety disorder, unspecified: Secondary | ICD-10-CM

## 2023-01-30 ENCOUNTER — Encounter: Payer: BC Managed Care – PPO | Admitting: Family Medicine

## 2023-02-23 ENCOUNTER — Encounter: Payer: BC Managed Care – PPO | Admitting: Family Medicine

## 2023-03-24 ENCOUNTER — Other Ambulatory Visit: Payer: Self-pay | Admitting: Family Medicine

## 2023-03-24 DIAGNOSIS — K582 Mixed irritable bowel syndrome: Secondary | ICD-10-CM

## 2023-03-24 DIAGNOSIS — F32A Depression, unspecified: Secondary | ICD-10-CM

## 2023-04-17 ENCOUNTER — Encounter: Payer: Self-pay | Admitting: Family Medicine

## 2023-04-17 ENCOUNTER — Ambulatory Visit (INDEPENDENT_AMBULATORY_CARE_PROVIDER_SITE_OTHER): Payer: BC Managed Care – PPO | Admitting: Family Medicine

## 2023-04-17 VITALS — BP 120/84 | HR 99 | Ht 61.0 in | Wt 138.0 lb

## 2023-04-17 DIAGNOSIS — F419 Anxiety disorder, unspecified: Secondary | ICD-10-CM | POA: Diagnosis not present

## 2023-04-17 DIAGNOSIS — K582 Mixed irritable bowel syndrome: Secondary | ICD-10-CM

## 2023-04-17 DIAGNOSIS — K219 Gastro-esophageal reflux disease without esophagitis: Secondary | ICD-10-CM

## 2023-04-17 DIAGNOSIS — Z0001 Encounter for general adult medical examination with abnormal findings: Secondary | ICD-10-CM

## 2023-04-17 DIAGNOSIS — Z23 Encounter for immunization: Secondary | ICD-10-CM

## 2023-04-17 DIAGNOSIS — F32A Depression, unspecified: Secondary | ICD-10-CM

## 2023-04-17 DIAGNOSIS — Z0000 Encounter for general adult medical examination without abnormal findings: Secondary | ICD-10-CM

## 2023-04-17 LAB — CBC WITH DIFFERENTIAL/PLATELET
Basophils Absolute: 0.1 10*3/uL (ref 0.0–0.2)
Basos: 1 %
EOS (ABSOLUTE): 0.4 10*3/uL (ref 0.0–0.4)
Eos: 5 %
Hematocrit: 36.4 % (ref 34.0–46.6)
Hemoglobin: 11.8 g/dL (ref 11.1–15.9)
Immature Grans (Abs): 0 10*3/uL (ref 0.0–0.1)
Immature Granulocytes: 0 %
Lymphocytes Absolute: 2.2 10*3/uL (ref 0.7–3.1)
Lymphs: 27 %
MCH: 26.8 pg (ref 26.6–33.0)
MCHC: 32.4 g/dL (ref 31.5–35.7)
MCV: 83 fL (ref 79–97)
Monocytes Absolute: 0.7 10*3/uL (ref 0.1–0.9)
Monocytes: 9 %
Neutrophils Absolute: 4.8 10*3/uL (ref 1.4–7.0)
Neutrophils: 58 %
Platelets: 293 10*3/uL (ref 150–450)
RBC: 4.4 x10E6/uL (ref 3.77–5.28)
RDW: 13.8 % (ref 11.7–15.4)
WBC: 8.2 10*3/uL (ref 3.4–10.8)

## 2023-04-17 LAB — CMP14+EGFR
ALT: 19 [IU]/L (ref 0–32)
AST: 20 [IU]/L (ref 0–40)
Albumin: 4.7 g/dL (ref 3.9–4.9)
Alkaline Phosphatase: 73 [IU]/L (ref 44–121)
BUN/Creatinine Ratio: 19 (ref 9–23)
BUN: 16 mg/dL (ref 6–24)
Bilirubin Total: 0.5 mg/dL (ref 0.0–1.2)
CO2: 22 mmol/L (ref 20–29)
Calcium: 9.5 mg/dL (ref 8.7–10.2)
Chloride: 100 mmol/L (ref 96–106)
Creatinine, Ser: 0.83 mg/dL (ref 0.57–1.00)
Globulin, Total: 2.7 g/dL (ref 1.5–4.5)
Glucose: 88 mg/dL (ref 70–99)
Potassium: 4.4 mmol/L (ref 3.5–5.2)
Sodium: 137 mmol/L (ref 134–144)
Total Protein: 7.4 g/dL (ref 6.0–8.5)
eGFR: 89 mL/min/{1.73_m2} (ref >59)

## 2023-04-17 LAB — LIPID PANEL
Chol/HDL Ratio: 3.7 ratio (ref 0.0–4.4)
Cholesterol, Total: 243 mg/dL — ABNORMAL HIGH (ref 100–199)
HDL: 66 mg/dL (ref >39)
LDL Chol Calc (NIH): 150 mg/dL — ABNORMAL HIGH (ref 0–99)
Triglycerides: 151 mg/dL — ABNORMAL HIGH (ref 0–149)
VLDL Cholesterol Cal: 27 mg/dL (ref 5–40)

## 2023-04-17 MED ORDER — ESOMEPRAZOLE MAGNESIUM 40 MG PO CPDR
40.0000 mg | DELAYED_RELEASE_CAPSULE | Freq: Two times a day (BID) | ORAL | 3 refills | Status: DC
Start: 2023-04-17 — End: 2024-02-19

## 2023-04-17 MED ORDER — DULOXETINE HCL 20 MG PO CPEP: 20.0000 mg | ORAL_CAPSULE | Freq: Every day | ORAL | 3 refills | Status: DC

## 2023-04-17 NOTE — Progress Notes (Signed)
BP 120/84   Pulse 99   Ht 5\' 1"  (1.549 m)   Wt 138 lb (62.6 kg)   SpO2 100%   BMI 26.07 kg/m    Subjective:   Patient ID: Katrina Duncan, female    DOB: [birth year], 41 y.o.   MRN: [id number]  HPI: Katrina Duncan is a 42 y.o. female presenting on 04/17/2023 for Medical Management of Chronic Issues (CPE) and Anxiety   HPI Physical exam Patient denies any chest pain, shortness of breath, headaches or vision issues, abdominal complaints, diarrhea, nausea, vomiting, or joint issues.   GERD Patient is currently on Nexium.  She denies any major symptoms or abdominal pain or belching or burping. She denies any blood in her stool or lightheadedness or dizziness.   Anxiety depression recheck Patient is coming in today for anxiety and depression recheck.  She currently takes duloxetine and then her GI doctor started her on amitriptyline to help with the stomach and she says she is doing well on both and that is helping her with sleeping.    11/15/2020    2:21 PM 11/09/2019    2:33 PM 02/23/2019    2:11 PM 06/17/2017    1:33 PM 12/25/2016    2:05 PM  Depression screen PHQ 2/9  Decreased Interest 0 0 0 0 0  Down, Depressed, Hopeless 0 0 0 0 0  PHQ - 2 Score 0 0 0 0 0     Relevant past medical, surgical, family and social history reviewed and updated as indicated. Interim medical history since our last visit reviewed. Allergies and medications reviewed and updated.  Review of Systems  Constitutional:  Negative for chills and fever.  HENT:  Negative for congestion, ear discharge, ear pain and tinnitus.   Eyes:  Negative for pain, redness and visual disturbance.  Respiratory:  Negative for cough, chest tightness, shortness of breath and wheezing.   Cardiovascular:  Negative for chest pain, palpitations and leg swelling.  Gastrointestinal:  Negative for abdominal pain, blood in stool, constipation and diarrhea.  Genitourinary:  Negative for difficulty urinating, dysuria  and hematuria.  Musculoskeletal:  Negative for back pain, gait problem and myalgias.  Skin:  Negative for rash.  Neurological:  Negative for dizziness, weakness, light-headedness and headaches.  Psychiatric/Behavioral:  Negative for agitation, behavioral problems and suicidal ideas.   All other systems reviewed and are negative.   Per HPI unless specifically indicated above   Allergies as of 04/17/2023       Reactions   Penicillins    hives   Wellbutrin [bupropion]         Medication List        Accurate as of April 17, 2023  9:56 AM. If you have any questions, ask your nurse or doctor.          amitriptyline 25 MG tablet Commonly known as: ELAVIL Take 25 mg by mouth at bedtime.   Biotin Maximum 10000 MCG Tbdp Generic drug: Biotin Take by mouth.   DULoxetine 20 MG capsule Commonly known as: CYMBALTA Take 1 capsule (20 mg total) by mouth daily.   esomeprazole 40 MG capsule Commonly known as: NEXIUM Take 1 capsule (40 mg total) by mouth 2 (two) times daily before a meal.   IRON 27 PO Take 25 mg by mouth daily.   lactobacillus acidophilus Tabs tablet Take 1 tablet by mouth daily.   NON FORMULARY Take by mouth daily. Pregneloone   vitamin C 250 MG tablet Commonly known as:  ASCORBIC ACID Take 250 mg by mouth 2 (two) times daily.   Womens Daily Formula Tabs Take by mouth.   zinc gluconate 50 MG tablet Take 50 mg by mouth daily.         Objective:   BP 120/84   Pulse 99   Ht 5\' 1"  (1.549 m)   Wt 138 lb (62.6 kg)   SpO2 100%   BMI 26.07 kg/m   Wt Readings from Last 3 Encounters:  04/17/23 138 lb (62.6 kg)  01/24/22 133 lb (60.3 kg)  07/19/21 130 lb (59 kg)    Physical Exam Vitals and nursing note reviewed.  Constitutional:      General: She is not in acute distress.    Appearance: She is well-developed. She is not diaphoretic.  HENT:     Right Ear: Tympanic membrane and ear canal normal.     Left Ear: Tympanic membrane and ear  canal normal.  Eyes:     Conjunctiva/sclera: Conjunctivae normal.  Cardiovascular:     Rate and Rhythm: Normal rate and regular rhythm.     Heart sounds: Normal heart sounds. No murmur heard. Pulmonary:     Effort: Pulmonary effort is normal. No respiratory distress.     Breath sounds: Normal breath sounds. No wheezing.  Abdominal:     General: Abdomen is flat. Bowel sounds are normal. There is no distension.     Palpations: Abdomen is soft.     Tenderness: There is no abdominal tenderness. There is no guarding.     Hernia: No hernia is present.  Musculoskeletal:        General: No tenderness. Normal range of motion.  Skin:    General: Skin is warm and dry.     Findings: No rash.  Neurological:     Mental Status: She is alert and oriented to person, place, and time.     Coordination: Coordination normal.  Psychiatric:        Behavior: Behavior normal.       Assessment & Plan:   Problem List Items Addressed This Visit       Digestive   Gastroesophageal reflux disease without esophagitis   Relevant Medications   esomeprazole (NEXIUM) 40 MG capsule   Other Relevant Orders   CBC with Differential/Platelet   CMP14+EGFR   Irritable bowel syndrome with both constipation and diarrhea   Relevant Medications   DULoxetine (CYMBALTA) 20 MG capsule   esomeprazole (NEXIUM) 40 MG capsule     Other   Anxiety and depression   Relevant Medications   DULoxetine (CYMBALTA) 20 MG capsule   amitriptyline (ELAVIL) 25 MG tablet   Other Relevant Orders   CBC with Differential/Platelet   Other Visit Diagnoses     Physical exam    -  Primary   Relevant Orders   CBC with Differential/Platelet   CMP14+EGFR   Lipid panel       Will do blood work today, blood pressure and everything else looks good.  She is focusing on increasing activity and exercise over the past 5 or 6 months. Follow up plan: Return in about 1 year (around 04/16/2024), or if symptoms worsen or fail to improve,  for Physical exam.  Counseling provided for all of the vaccine components Orders Placed This Encounter  Procedures   CBC with Differential/Platelet   CMP14+EGFR   Lipid panel    Arville Care, MD [facility] 04/17/2023, 9:56 AM

## 2023-04-21 ENCOUNTER — Encounter: Payer: Self-pay | Admitting: Family Medicine

## 2023-04-22 ENCOUNTER — Encounter: Payer: Self-pay | Admitting: Family Medicine

## 2023-04-24 ENCOUNTER — Encounter: Payer: Self-pay | Admitting: Family Medicine

## 2023-09-04 ENCOUNTER — Encounter: Payer: Self-pay | Admitting: Family Medicine

## 2024-02-19 ENCOUNTER — Other Ambulatory Visit: Payer: Self-pay | Admitting: Family Medicine

## 2024-02-19 DIAGNOSIS — K219 Gastro-esophageal reflux disease without esophagitis: Secondary | ICD-10-CM

## 2024-02-22 ENCOUNTER — Telehealth: Payer: Self-pay | Admitting: Family Medicine

## 2024-02-22 NOTE — Telephone Encounter (Signed)
 Are you alright to work her in sooner?

## 2024-02-22 NOTE — Telephone Encounter (Signed)
 Yes I am okay with working her in sooner, I do have some things available so we can put her in somewhere preferably not on a DOD day

## 2024-02-22 NOTE — Telephone Encounter (Signed)
 Copied from CRM [id number]. Topic: Appointments - Scheduling Inquiry for Clinic >> Feb 22, 2024 12:26 PM Rosaria BRAVO wrote: Reason for CRM: Pt called reporting that she needs to be seen prior to the 15th of November due to her insurance changes. Please advise, pt's appt is added to the waitlist. She says she will to change doctors altogether if she is unable to be seen in time.   Best contact: [phone]   ----------------------------------------------------------------------- From previous Reason for Contact - Cancel/Reschedule: Patient/patient representative is calling to cancel or reschedule an appointment. Refer to attachments for appointment information.

## 2024-02-23 NOTE — Telephone Encounter (Signed)
 Appt made for CPE no pap ( sees GYN) for 9/26 at 8:55am. Pt aware.

## 2024-02-26 ENCOUNTER — Encounter: Payer: Self-pay | Admitting: Family Medicine

## 2024-02-26 ENCOUNTER — Ambulatory Visit: Admitting: Family Medicine

## 2024-02-26 ENCOUNTER — Other Ambulatory Visit (HOSPITAL_COMMUNITY): Payer: Self-pay

## 2024-02-26 VITALS — BP 111/84 | HR 80 | Ht 61.0 in | Wt 126.0 lb

## 2024-02-26 DIAGNOSIS — K219 Gastro-esophageal reflux disease without esophagitis: Secondary | ICD-10-CM | POA: Diagnosis not present

## 2024-02-26 DIAGNOSIS — Z23 Encounter for immunization: Secondary | ICD-10-CM | POA: Diagnosis not present

## 2024-02-26 DIAGNOSIS — Z0001 Encounter for general adult medical examination with abnormal findings: Secondary | ICD-10-CM | POA: Diagnosis not present

## 2024-02-26 DIAGNOSIS — K582 Mixed irritable bowel syndrome: Secondary | ICD-10-CM

## 2024-02-26 DIAGNOSIS — F419 Anxiety disorder, unspecified: Secondary | ICD-10-CM | POA: Diagnosis not present

## 2024-02-26 DIAGNOSIS — F32A Depression, unspecified: Secondary | ICD-10-CM

## 2024-02-26 DIAGNOSIS — Z0000 Encounter for general adult medical examination without abnormal findings: Secondary | ICD-10-CM

## 2024-02-26 MED ORDER — DULOXETINE HCL 20 MG PO CPEP
20.0000 mg | ORAL_CAPSULE | Freq: Every day | ORAL | 3 refills | Status: AC
  Filled 2024-02-26: qty 90, 90d supply, fill #0

## 2024-02-26 MED ORDER — ESOMEPRAZOLE MAGNESIUM 40 MG PO CPDR
40.0000 mg | DELAYED_RELEASE_CAPSULE | Freq: Two times a day (BID) | ORAL | 3 refills | Status: AC
Start: 2024-02-26 — End: ?
  Filled 2024-02-26: qty 60, 30d supply, fill #0

## 2024-02-26 NOTE — Progress Notes (Signed)
 BP 111/84   Pulse 80   Ht 5' 1 (1.549 m)   Wt 126 lb (57.2 kg)   SpO2 100%   BMI 23.81 kg/m    Subjective:   Patient ID: Katrina Duncan, female    DOB: [birth year], 43 y.o.   MRN: [id number]  HPI: Katrina Duncan is a 42 y.o. female presenting on 02/26/2024 for Medical Management of Chronic Issues (CPE- no pap)   Discussed the use of AI scribe software for clinical note transcription with the patient, who gave verbal consent to proceed.  History of Present Illness   Katrina Duncan is a 43 year old female who presents for an annual physical exam.  Neurocognitive and constitutional symptoms - Fatigue, lack of energy, and brain fog attributed to age - No symptoms of depression or anxiety - Describes overall well-being as good except for work-related stress  Gynecologic and menstrual health - Regular menstrual cycles - No symptoms of menopause - No breast issues, lumps, or bumps - Mammogram scheduled for next month - Plans to have a pap smear next month  Sleep quality - Good sleep quality at night - No snoring or symptoms of sleep apnea - Occasional tossing and turning  Exercise tolerance and musculoskeletal symptoms - Maintains a workout routine five days per week - Experienced nausea after an abdominal workout - Dry needling attempted for relief, ineffective this time but helpful in the past  Medication history and recent illness - Stable after discontinuing a certain medication, with only one intense episode followed by illness - Currently taking duloxetine  and Nexium           Relevant past medical, surgical, family and social history reviewed and updated as indicated. Interim medical history since our last visit reviewed. Allergies and medications reviewed and updated.  Review of Systems  Constitutional:  Negative for chills and fever.  HENT:  Negative for congestion, ear discharge and ear pain.   Eyes:  Negative for redness and  visual disturbance.  Respiratory:  Negative for chest tightness and shortness of breath.   Cardiovascular:  Negative for chest pain and leg swelling.  Genitourinary:  Negative for difficulty urinating and dysuria.  Musculoskeletal:  Negative for back pain and gait problem.  Skin:  Negative for rash.  Neurological:  Negative for dizziness, light-headedness and headaches.  Psychiatric/Behavioral:  Negative for agitation and behavioral problems.   All other systems reviewed and are negative.   Per HPI unless specifically indicated above   Allergies as of 02/26/2024       Reactions   Penicillins    hives   Wellbutrin  [bupropion ]         Medication List        Accurate as of February 26, 2024  9:17 AM. If you have any questions, ask your nurse or doctor.          Biotin Maximum 10000 MCG Tbdp Generic drug: Biotin Take by mouth.   DULoxetine  20 MG capsule Commonly known as: CYMBALTA  Take 1 capsule (20 mg total) by mouth daily.   esomeprazole  40 MG capsule Commonly known as: NEXIUM  Take 1 capsule (40 mg total) by mouth 2 (two) times daily before a meal. What changed: See the new instructions. Changed by: [facility] [employer] [facility]   IRON 27 PO Take 25 mg by mouth daily.   lactobacillus acidophilus Tabs tablet Take 1 tablet by mouth daily.   NON FORMULARY Take by mouth daily. Pregneloone   vitamin C 250 MG tablet  Commonly known as: ASCORBIC ACID Take 250 mg by mouth 2 (two) times daily.   Womens Daily Formula Tabs Take by mouth.   zinc  gluconate 50 MG tablet Take 50 mg by mouth daily.         Objective:   BP 111/84   Pulse 80   Ht 5' 1 (1.549 m)   Wt 126 lb (57.2 kg)   SpO2 100%   BMI 23.81 kg/m   Wt Readings from Last 3 Encounters:  02/26/24 126 lb (57.2 kg)  04/17/23 138 lb (62.6 kg)  01/24/22 133 lb (60.3 kg)    Physical Exam Physical Exam   VITALS: BP- 111/84 NECK: Thyroid  normal. No cervical adenopathy. CHEST: Lungs clear to auscultation  bilaterally. CARDIOVASCULAR: Regular rate and rhythm, no murmurs. NEUROLOGICAL: Normal reflexes.         Assessment & Plan:   Problem List Items Addressed This Visit       Digestive   Gastroesophageal reflux disease without esophagitis   Relevant Medications   esomeprazole  (NEXIUM ) 40 MG capsule   Other Relevant Orders   CBC with Differential/Platelet   CMP14+EGFR   Lipid panel   TSH   Irritable bowel syndrome with both constipation and diarrhea   Relevant Medications   DULoxetine  (CYMBALTA ) 20 MG capsule   esomeprazole  (NEXIUM ) 40 MG capsule   Other Relevant Orders   CBC with Differential/Platelet   CMP14+EGFR   Lipid panel   TSH     Other   Anxiety and depression   Relevant Medications   DULoxetine  (CYMBALTA ) 20 MG capsule   Other Relevant Orders   CBC with Differential/Platelet   CMP14+EGFR   Lipid panel   TSH   Other Visit Diagnoses       Physical exam    -  Primary   Relevant Orders   CBC with Differential/Platelet   CMP14+EGFR   Lipid panel   TSH          Adult Wellness Visit Routine wellness visit with no significant concerns. Discussed fatigue and brain fog, possibly age or thyroid -related. - Order thyroid  function test. - Continue duloxetine  and Nexium . - Schedule follow-up in one year unless issues arise.  Anxiety disorder Anxiety well-managed with duloxetine . No depression or significant anxiety symptoms. - Continue duloxetine .  Gastroesophageal reflux disease (GERD) Chronic GERD managed with Nexium . No new symptoms. - Continue Nexium .  General Health Maintenance Routine health maintenance up to date. Mammogram and Pap smear scheduled. - Ensure mammogram next month.          Follow up plan: Return in about 1 year (around 02/25/2025), or if symptoms worsen or fail to improve, for Physical exam.  Counseling provided for all of the vaccine components Orders Placed This Encounter  Procedures   CBC with Differential/Platelet    CMP14+EGFR   Lipid panel   TSH    Fonda Levins, MD [facility] 02/26/2024, 9:17 AM

## 2024-02-27 LAB — CBC WITH DIFFERENTIAL/PLATELET
Basophils Absolute: 0.1 x10E3/uL (ref 0.0–0.2)
Basos: 1 %
EOS (ABSOLUTE): 0.4 x10E3/uL (ref 0.0–0.4)
Eos: 5 %
Hematocrit: 33.7 % — ABNORMAL LOW (ref 34.0–46.6)
Hemoglobin: 9.9 g/dL — ABNORMAL LOW (ref 11.1–15.9)
Immature Grans (Abs): 0 x10E3/uL (ref 0.0–0.1)
Immature Granulocytes: 0 %
Lymphocytes Absolute: 2.5 x10E3/uL (ref 0.7–3.1)
Lymphs: 36 %
MCH: 24 pg — ABNORMAL LOW (ref 26.6–33.0)
MCHC: 29.4 g/dL — ABNORMAL LOW (ref 31.5–35.7)
MCV: 82 fL (ref 79–97)
Monocytes Absolute: 0.5 x10E3/uL (ref 0.1–0.9)
Monocytes: 7 %
Neutrophils Absolute: 3.4 x10E3/uL (ref 1.4–7.0)
Neutrophils: 51 %
Platelets: 370 x10E3/uL (ref 150–450)
RBC: 4.13 x10E6/uL (ref 3.77–5.28)
RDW: 14.7 % (ref 11.7–15.4)
WBC: 6.9 x10E3/uL (ref 3.4–10.8)

## 2024-02-27 LAB — CMP14+EGFR
ALT: 16 IU/L (ref 0–32)
AST: 18 IU/L (ref 0–40)
Albumin: 4.6 g/dL (ref 3.9–4.9)
Alkaline Phosphatase: 61 IU/L (ref 41–116)
BUN/Creatinine Ratio: 22 (ref 9–23)
BUN: 18 mg/dL (ref 6–24)
Bilirubin Total: 0.5 mg/dL (ref 0.0–1.2)
CO2: 23 mmol/L (ref 20–29)
Calcium: 9.3 mg/dL (ref 8.7–10.2)
Chloride: 101 mmol/L (ref 96–106)
Creatinine, Ser: 0.83 mg/dL (ref 0.57–1.00)
Globulin, Total: 2.6 g/dL (ref 1.5–4.5)
Glucose: 82 mg/dL (ref 70–99)
Potassium: 4.1 mmol/L (ref 3.5–5.2)
Sodium: 138 mmol/L (ref 134–144)
Total Protein: 7.2 g/dL (ref 6.0–8.5)
eGFR: 89 mL/min/1.73 (ref >59)

## 2024-02-27 LAB — LIPID PANEL
Chol/HDL Ratio: 3.8 ratio (ref 0.0–4.4)
Cholesterol, Total: 263 mg/dL — ABNORMAL HIGH (ref 100–199)
HDL: 70 mg/dL (ref >39)
LDL Chol Calc (NIH): 174 mg/dL — ABNORMAL HIGH (ref 0–99)
Triglycerides: 107 mg/dL (ref 0–149)
VLDL Cholesterol Cal: 19 mg/dL (ref 5–40)

## 2024-02-27 LAB — TSH: TSH: 0.9 u[IU]/mL (ref 0.450–4.500)

## 2024-02-29 ENCOUNTER — Other Ambulatory Visit: Payer: Self-pay

## 2024-02-29 ENCOUNTER — Ambulatory Visit: Payer: Self-pay | Admitting: Family Medicine

## 2024-02-29 DIAGNOSIS — D649 Anemia, unspecified: Secondary | ICD-10-CM

## 2024-02-29 NOTE — Telephone Encounter (Signed)
 At pts request mailed most recent lab results for pts insurance to her home address. Pt aware.

## 2024-03-02 ENCOUNTER — Encounter: Payer: Self-pay | Admitting: Family Medicine

## 2024-03-02 ENCOUNTER — Other Ambulatory Visit: Payer: Self-pay

## 2024-03-02 DIAGNOSIS — D649 Anemia, unspecified: Secondary | ICD-10-CM

## 2024-03-02 NOTE — Telephone Encounter (Signed)
 I called and spoke with patient and she confirmed that she has not been taking an Iron Supp but will start taking them daily (25mg ). Wants to ask Dr [name] if its necessary for her to still come in and have labs rechecked since she hasn't been taking the iron supp because she noticed that her Hgb has been consistently low at all of her checks? Please advise.

## 2024-03-09 ENCOUNTER — Other Ambulatory Visit (HOSPITAL_COMMUNITY): Payer: Self-pay

## 2024-03-10 ENCOUNTER — Encounter: Payer: Self-pay | Admitting: Pharmacist

## 2024-03-10 ENCOUNTER — Other Ambulatory Visit: Payer: Self-pay

## 2024-03-14 ENCOUNTER — Encounter: Payer: Self-pay | Admitting: Family Medicine

## 2024-03-15 ENCOUNTER — Other Ambulatory Visit: Payer: Self-pay

## 2024-03-18 ENCOUNTER — Other Ambulatory Visit

## 2024-03-20 ENCOUNTER — Other Ambulatory Visit: Payer: Self-pay | Admitting: Family Medicine

## 2024-03-20 DIAGNOSIS — F419 Anxiety disorder, unspecified: Secondary | ICD-10-CM

## 2024-03-20 DIAGNOSIS — K582 Mixed irritable bowel syndrome: Secondary | ICD-10-CM

## 2024-03-25 ENCOUNTER — Other Ambulatory Visit

## 2024-03-25 DIAGNOSIS — D649 Anemia, unspecified: Secondary | ICD-10-CM

## 2024-03-26 ENCOUNTER — Encounter: Payer: Self-pay | Admitting: Family Medicine

## 2024-03-26 LAB — ANEMIA PROFILE B
Basophils Absolute: 0.1 x10E3/uL (ref 0.0–0.2)
Basos: 1 %
EOS (ABSOLUTE): 0.4 x10E3/uL (ref 0.0–0.4)
Eos: 6 %
Ferritin: 38 ng/mL (ref 15–150)
Folate: >20 ng/mL (ref >3.0)
Hematocrit: 40.1 % (ref 34.0–46.6)
Hemoglobin: 12.4 g/dL (ref 11.1–15.9)
Immature Grans (Abs): 0 x10E3/uL (ref 0.0–0.1)
Immature Granulocytes: 0 %
Iron Saturation: 15 % (ref 15–55)
Iron: 50 ug/dL (ref 27–159)
Lymphocytes Absolute: 2.2 x10E3/uL (ref 0.7–3.1)
Lymphs: 33 %
MCH: 26.7 pg (ref 26.6–33.0)
MCHC: 30.9 g/dL — ABNORMAL LOW (ref 31.5–35.7)
MCV: 86 fL (ref 79–97)
Monocytes Absolute: 0.7 x10E3/uL (ref 0.1–0.9)
Monocytes: 9 %
Neutrophils Absolute: 3.5 x10E3/uL (ref 1.4–7.0)
Neutrophils: 51 %
Platelets: 276 x10E3/uL (ref 150–450)
RBC: 4.64 x10E6/uL (ref 3.77–5.28)
RDW: 19.4 % — ABNORMAL HIGH (ref 11.7–15.4)
Retic Ct Pct: 1.5 % (ref 0.6–2.6)
Total Iron Binding Capacity: 337 ug/dL (ref 250–450)
UIBC: 287 ug/dL (ref 131–425)
Vitamin B-12: 1376 pg/mL — ABNORMAL HIGH (ref 232–1245)
WBC: 6.9 x10E3/uL (ref 3.4–10.8)

## 2024-03-28 ENCOUNTER — Ambulatory Visit: Payer: Self-pay | Admitting: Family Medicine

## 2024-04-18 ENCOUNTER — Encounter: Admitting: Family Medicine

## 2024-05-20 ENCOUNTER — Other Ambulatory Visit: Payer: Self-pay

## 2024-05-20 ENCOUNTER — Telehealth: Payer: Self-pay

## 2024-05-20 DIAGNOSIS — F419 Anxiety disorder, unspecified: Secondary | ICD-10-CM

## 2024-05-20 DIAGNOSIS — K582 Mixed irritable bowel syndrome: Secondary | ICD-10-CM

## 2024-05-20 MED ORDER — DULOXETINE HCL 20 MG PO CPEP: 20.0000 mg | ORAL_CAPSULE | Freq: Every day | ORAL | 3 refills | Status: DC

## 2024-05-20 MED ORDER — DULOXETINE HCL 20 MG PO CPEP: 20.0000 mg | ORAL_CAPSULE | Freq: Every day | ORAL | 3 refills | Status: AC

## 2024-05-20 NOTE — Telephone Encounter (Signed)
 Copied from CRM #[id number]. Topic: Clinical - Prescription Issue >> May 20, 2024 11:11 AM Wess RAMAN wrote: Reason for CRM: Patient stated her DULoxetine  (CYMBALTA ) 20 MG capsule should be going to the mail order pharmacy  Callback #: [phone]  Pharmacy: [facility]) - [place], [place] - [address] [facility] [address] [place] [address] [place] [address] Phone: [phone] Fax: [phone] Hours: Not open 24 hours

## 2024-05-20 NOTE — Telephone Encounter (Signed)
 Cymbalta  sent to mail order.

## 2024-06-19 ENCOUNTER — Encounter: Payer: Self-pay | Admitting: Family Medicine

## 2024-06-19 DIAGNOSIS — M545 Low back pain, unspecified: Secondary | ICD-10-CM
